# Patient Record
Sex: Female | Born: 1965
Health system: Southern US, Community
[De-identification: ages and names within clinical notes are randomized; demographics above are authoritative.]

## PROBLEM LIST (undated history)

## (undated) DIAGNOSIS — F329 Major depressive disorder, single episode, unspecified: Secondary | ICD-10-CM

## (undated) DIAGNOSIS — T7840XA Allergy, unspecified, initial encounter: Secondary | ICD-10-CM

## (undated) DIAGNOSIS — F419 Anxiety disorder, unspecified: Secondary | ICD-10-CM

## (undated) DIAGNOSIS — D249 Benign neoplasm of unspecified breast: Secondary | ICD-10-CM

## (undated) DIAGNOSIS — F32A Depression, unspecified: Secondary | ICD-10-CM

## (undated) DIAGNOSIS — F988 Other specified behavioral and emotional disorders with onset usually occurring in childhood and adolescence: Secondary | ICD-10-CM

## (undated) DIAGNOSIS — R896 Abnormal cytological findings in specimens from other organs, systems and tissues: Secondary | ICD-10-CM

## (undated) DIAGNOSIS — K219 Gastro-esophageal reflux disease without esophagitis: Secondary | ICD-10-CM

## (undated) DIAGNOSIS — N289 Disorder of kidney and ureter, unspecified: Secondary | ICD-10-CM

## (undated) HISTORY — PX: EYE SURGERY: SHX253

## (undated) HISTORY — DX: Disorder of kidney and ureter, unspecified: N28.9

## (undated) HISTORY — PX: TONSILLECTOMY: SUR1361

## (undated) HISTORY — DX: Major depressive disorder, single episode, unspecified: F32.9

## (undated) HISTORY — DX: Abnormal cytological findings in specimens from other organs, systems and tissues: R89.6

## (undated) HISTORY — DX: Depression, unspecified: F32.A

## (undated) HISTORY — DX: Benign neoplasm of unspecified breast: D24.9

## (undated) HISTORY — DX: Gastro-esophageal reflux disease without esophagitis: K21.9

## (undated) HISTORY — PX: ANTERIOR CRUCIATE LIGAMENT REPAIR: SHX115

## (undated) HISTORY — DX: Other specified behavioral and emotional disorders with onset usually occurring in childhood and adolescence: F98.8

## (undated) HISTORY — DX: Allergy, unspecified, initial encounter: T78.40XA

## (undated) HISTORY — DX: Anxiety disorder, unspecified: F41.9

---

## 1995-01-30 HISTORY — PX: BREAST FIBROADENOMA SURGERY: SHX580

## 2000-03-20 ENCOUNTER — Encounter: Admission: RE | Admit: 2000-03-20 | Discharge: 2000-03-20 | Payer: Self-pay | Admitting: Family Medicine

## 2000-03-20 ENCOUNTER — Encounter: Payer: Self-pay | Admitting: Family Medicine

## 2000-10-07 ENCOUNTER — Encounter: Payer: Self-pay | Admitting: Family Medicine

## 2000-10-07 ENCOUNTER — Encounter: Admission: RE | Admit: 2000-10-07 | Discharge: 2000-10-07 | Payer: Self-pay | Admitting: Family Medicine

## 2001-03-01 ENCOUNTER — Emergency Department (HOSPITAL_COMMUNITY): Admission: EM | Admit: 2001-03-01 | Discharge: 2001-03-01 | Payer: Self-pay | Admitting: Emergency Medicine

## 2003-09-01 ENCOUNTER — Other Ambulatory Visit: Admission: RE | Admit: 2003-09-01 | Discharge: 2003-09-01 | Payer: Self-pay | Admitting: Family Medicine

## 2004-10-04 ENCOUNTER — Other Ambulatory Visit: Admission: RE | Admit: 2004-10-04 | Discharge: 2004-10-04 | Payer: Self-pay | Admitting: Gynecology

## 2005-10-05 ENCOUNTER — Other Ambulatory Visit: Admission: RE | Admit: 2005-10-05 | Discharge: 2005-10-05 | Payer: Self-pay | Admitting: Gynecology

## 2006-10-11 ENCOUNTER — Other Ambulatory Visit: Admission: RE | Admit: 2006-10-11 | Discharge: 2006-10-11 | Payer: Self-pay | Admitting: Gynecology

## 2007-10-16 ENCOUNTER — Ambulatory Visit: Payer: Self-pay | Admitting: Women's Health

## 2007-10-16 ENCOUNTER — Encounter: Payer: Self-pay | Admitting: Women's Health

## 2007-10-16 ENCOUNTER — Other Ambulatory Visit: Admission: RE | Admit: 2007-10-16 | Discharge: 2007-10-16 | Payer: Self-pay | Admitting: Gynecology

## 2008-01-30 DIAGNOSIS — IMO0001 Reserved for inherently not codable concepts without codable children: Secondary | ICD-10-CM

## 2008-01-30 HISTORY — DX: Reserved for inherently not codable concepts without codable children: IMO0001

## 2008-12-31 ENCOUNTER — Ambulatory Visit: Payer: Self-pay | Admitting: Women's Health

## 2008-12-31 ENCOUNTER — Other Ambulatory Visit: Admission: RE | Admit: 2008-12-31 | Discharge: 2008-12-31 | Payer: Self-pay | Admitting: Gynecology

## 2009-05-25 ENCOUNTER — Ambulatory Visit (HOSPITAL_COMMUNITY): Admission: RE | Admit: 2009-05-25 | Discharge: 2009-05-25 | Payer: Self-pay | Admitting: Sports Medicine

## 2009-06-07 ENCOUNTER — Emergency Department (HOSPITAL_COMMUNITY): Admission: EM | Admit: 2009-06-07 | Discharge: 2009-06-07 | Payer: Self-pay | Admitting: Emergency Medicine

## 2009-07-27 ENCOUNTER — Ambulatory Visit: Payer: Self-pay | Admitting: Women's Health

## 2009-07-27 ENCOUNTER — Other Ambulatory Visit: Admission: RE | Admit: 2009-07-27 | Discharge: 2009-07-27 | Payer: Self-pay | Admitting: Gynecology

## 2009-08-03 ENCOUNTER — Ambulatory Visit (HOSPITAL_COMMUNITY): Admission: RE | Admit: 2009-08-03 | Discharge: 2009-08-03 | Payer: Self-pay | Admitting: Gynecology

## 2010-04-18 LAB — URINALYSIS, ROUTINE W REFLEX MICROSCOPIC
Glucose, UA: NEGATIVE mg/dL
Ketones, ur: 40 mg/dL — AB
Nitrite: NEGATIVE
Protein, ur: 30 mg/dL — AB
Specific Gravity, Urine: 1.027 (ref 1.005–1.030)
Urobilinogen, UA: 0.2 mg/dL (ref 0.0–1.0)
pH: 6 (ref 5.0–8.0)

## 2010-04-18 LAB — DIFFERENTIAL
Basophils Absolute: 0 10*3/uL (ref 0.0–0.1)
Basophils Relative: 0 % (ref 0–1)
Eosinophils Absolute: 0.1 10*3/uL (ref 0.0–0.7)
Monocytes Relative: 4 % (ref 3–12)
Neutro Abs: 10 10*3/uL — ABNORMAL HIGH (ref 1.7–7.7)
Neutrophils Relative %: 89 % — ABNORMAL HIGH (ref 43–77)

## 2010-04-18 LAB — COMPREHENSIVE METABOLIC PANEL
Alkaline Phosphatase: 64 U/L (ref 39–117)
BUN: 12 mg/dL (ref 6–23)
CO2: 19 mEq/L (ref 19–32)
GFR calc non Af Amer: >60 mL/min (ref >60)
Glucose, Bld: 129 mg/dL — ABNORMAL HIGH (ref 70–99)
Potassium: 4.1 mEq/L (ref 3.5–5.1)
Total Bilirubin: 1.1 mg/dL (ref 0.3–1.2)
Total Protein: 7.3 g/dL (ref 6.0–8.3)

## 2010-04-18 LAB — CBC
HCT: 44 % (ref 36.0–46.0)
Hemoglobin: 15.8 g/dL — ABNORMAL HIGH (ref 12.0–15.0)
RBC: 4.91 MIL/uL (ref 3.87–5.11)
RDW: 12.3 % (ref 11.5–15.5)

## 2010-04-18 LAB — LIPASE, BLOOD: Lipase: 21 U/L (ref 11–59)

## 2010-04-18 LAB — URINE MICROSCOPIC-ADD ON

## 2010-04-18 LAB — PREGNANCY, URINE: Preg Test, Ur: NEGATIVE

## 2011-03-07 IMAGING — CT CT ABD-PELV W/ CM
2 of 5 series · 17 of 46 positions shown, 19 images · IV contrast (water protocol & 100 ml omni 300)
Comparison: None.

CLINICAL DATA: Abdominal pain.  Vomiting.  Diarrhea.  Left-sided
pain.

CT ABDOMEN AND PELVIS WITH CONTRAST
TECHNIQUE: Multidetector CT imaging of the abdomen and pelvis was
performed following the standard protocol during bolus
administration of intravenous contrast.
Contrast: 100  ml [id number]

[Series 2: abd pelvis · axial · 0.90mm/px · z∈[-466,-41]mm · 14 of 96 slices shown, 16 images]
[im 6/96  soft-tissue]
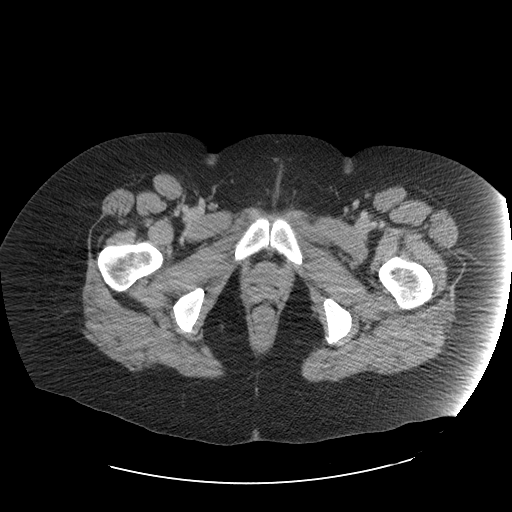
[im 6/96  bone]
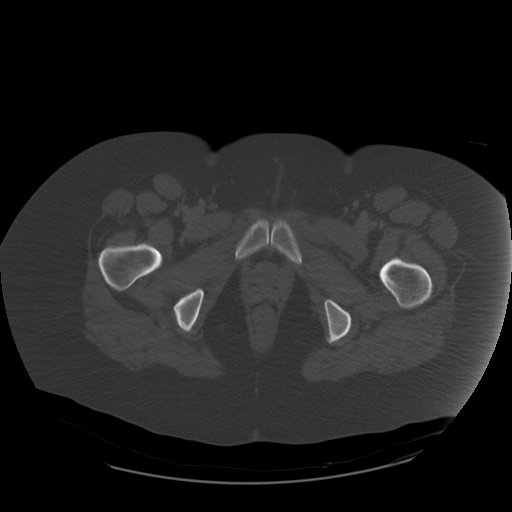
[im 11/96  soft-tissue]
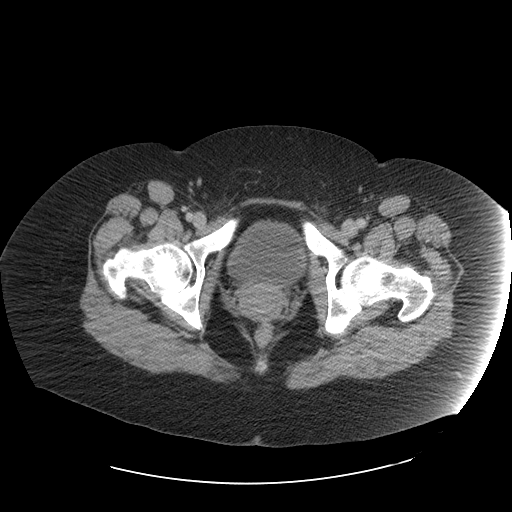
[im 21/96  soft-tissue]
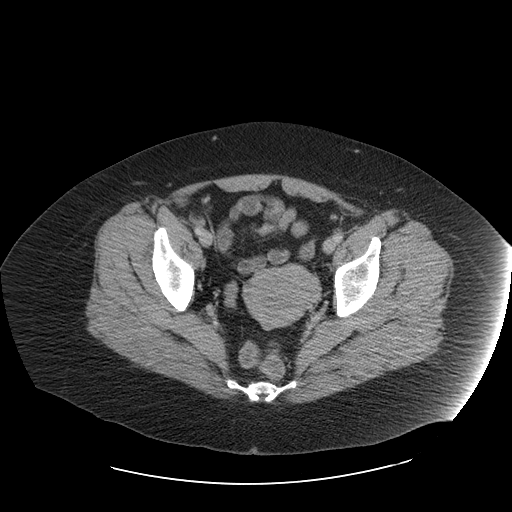
[im 26/96  soft-tissue]
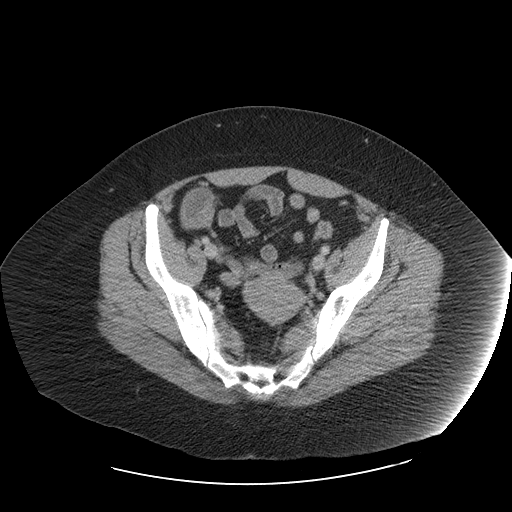
[im 31/96  soft-tissue]
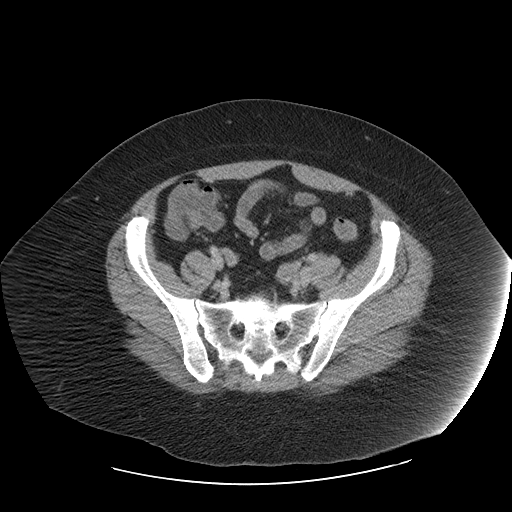
[im 41/96  soft-tissue]
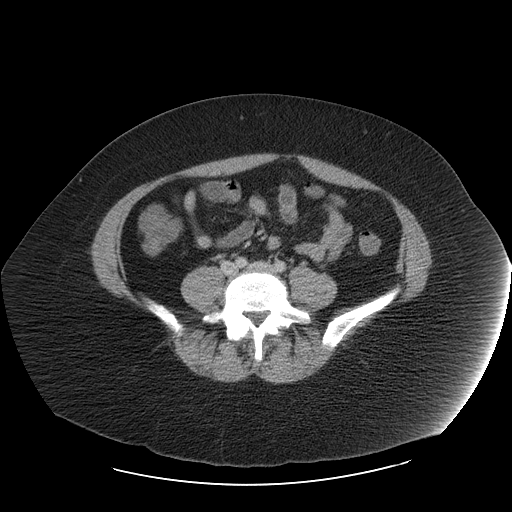
[im 46/96  soft-tissue]
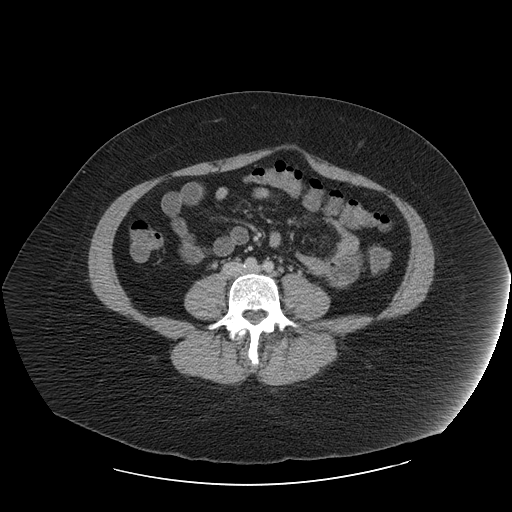
[im 51/96  soft-tissue]
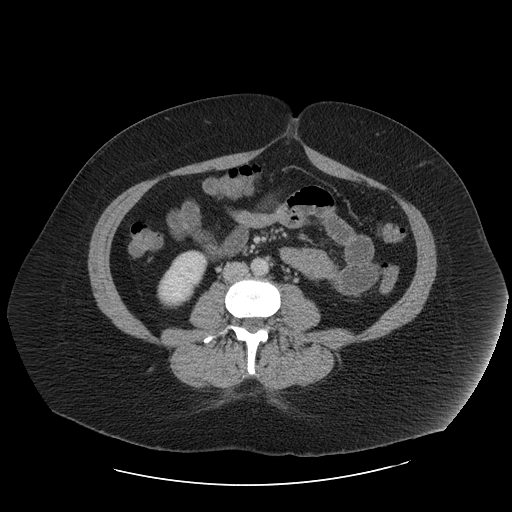
[im 56/96  soft-tissue]
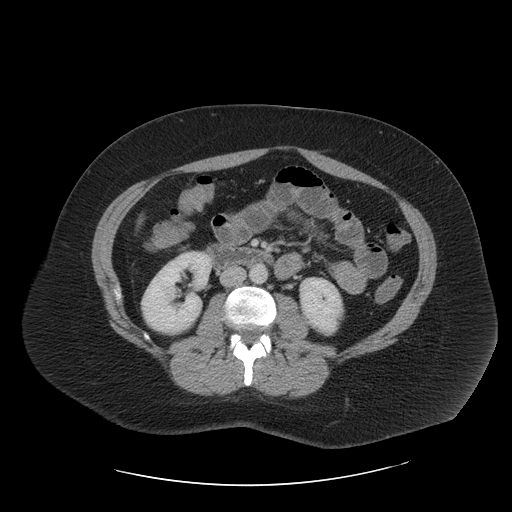
[im 56/96  bone]
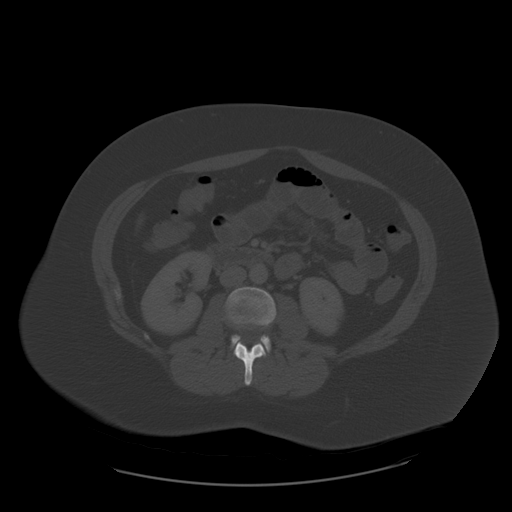
[im 66/96  soft-tissue]
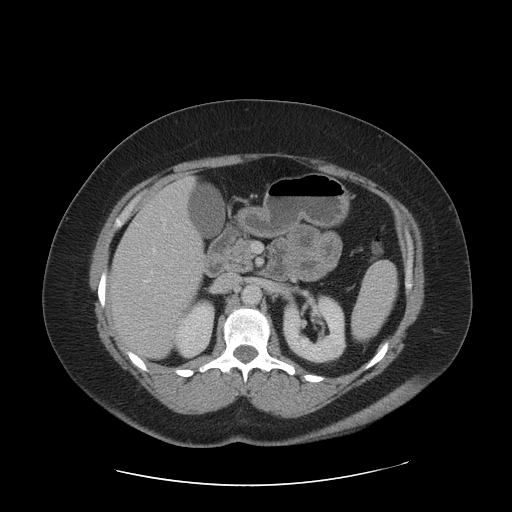
[im 71/96  soft-tissue]
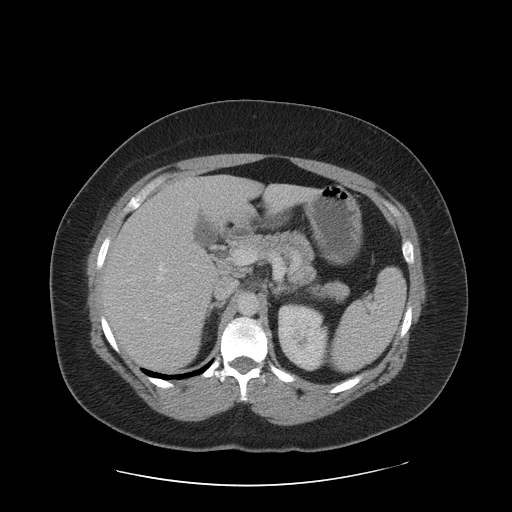
[im 76/96  soft-tissue]
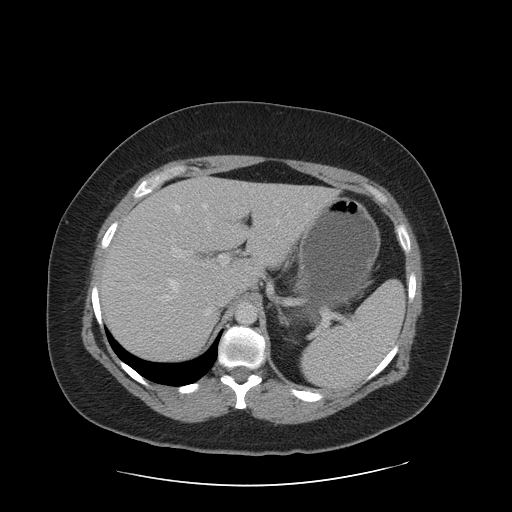
[im 86/96  soft-tissue]
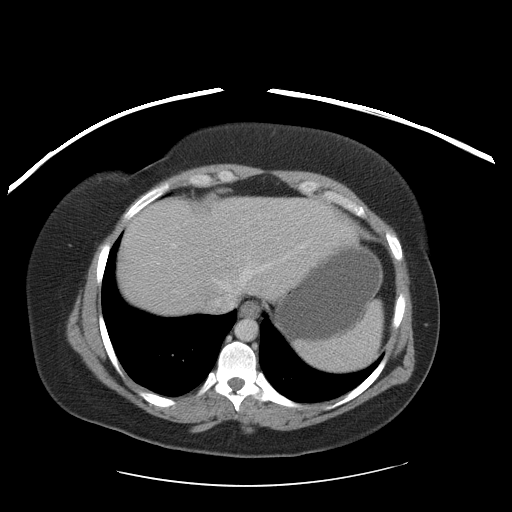
[im 91/96  soft-tissue]
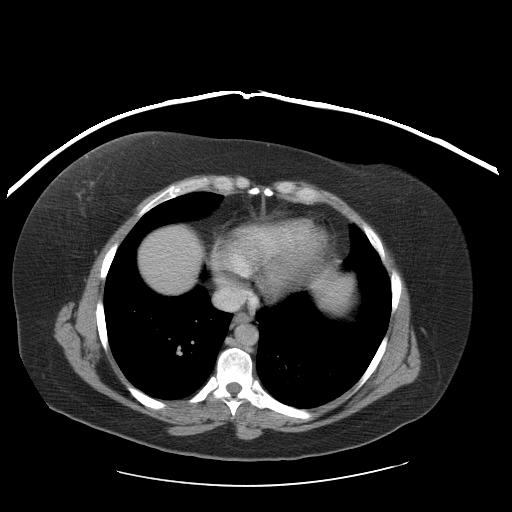

[Series 401: cor · coronal · 0.96mm/px · 3 of 99 slices shown]
[im 33/99  soft-tissue]
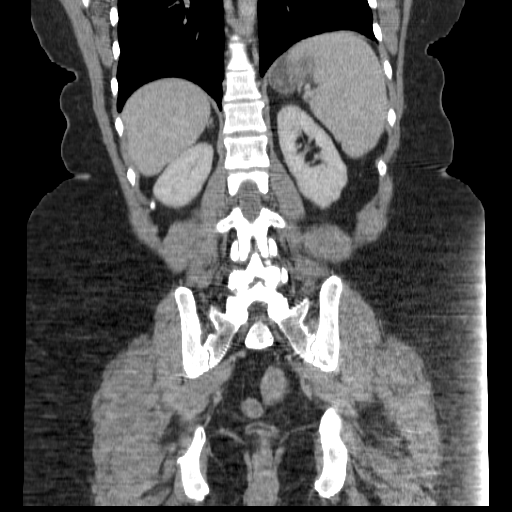
[im 44/99  soft-tissue]
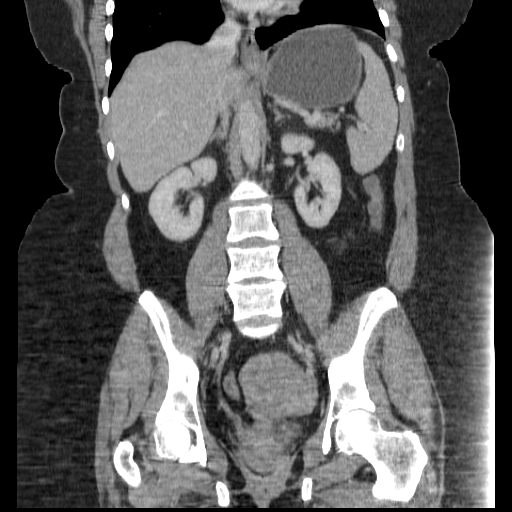
[im 55/99  soft-tissue]
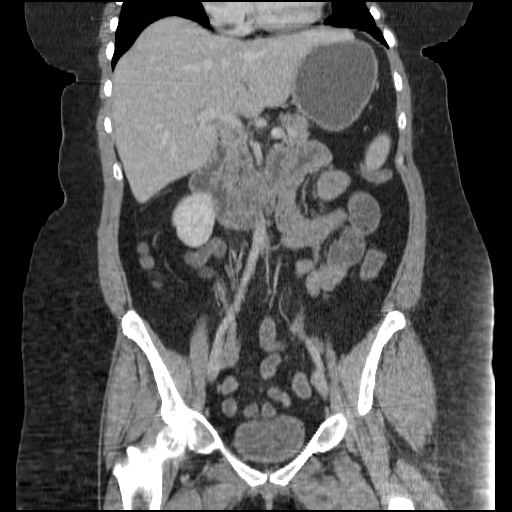

[17 of 46 positions shown; findings below may reference images not displayed]

FINDINGS: Clear lung bases. Normal heart size without pericardial
or pleural effusion.  Normal liver, spleen, stomach and, pancreas,
gallbladder, biliary tract, adrenal glands.  A central interpolar
right renal lesion measures 9 mm on axial image 40.  Greater than
fluid density, at approximately 58 HU.  Also coronal image 54.

Normal left kidney. No retroperitoneal or retrocrural adenopathy.

Normal colon and terminal ileum.  Normal appendix, including on
image 71 transverse. Normal small bowel without ascites.

  No pelvic adenopathy.    Normal urinary bladder.  Soft tissue
fullness about the left side of the uterus is suspicious for
uterine fibroid(s).  Example image 78.  No adnexal mass or
significant free fluid. No acute osseous abnormality.
IMPRESSION: 1. No acute process in the abdomen or pelvis.
2.  9 mm central interpolar right renal lesion.  Not a simple cyst.
This could represent a hemorrhagic / complex cyst or a renal cell
carcinoma.  Given its location and size, pre and post contrast MRI
would be the test of choice.  This could be performed non
emergently (i.e. as an outpatient).
3.  Fibroid uterus.

## 2011-03-12 ENCOUNTER — Other Ambulatory Visit: Payer: Self-pay | Admitting: Family Medicine

## 2011-03-12 MED ORDER — ESCITALOPRAM OXALATE 20 MG PO TABS: 20.0000 mg | ORAL_TABLET | Freq: Every day | ORAL | Status: DC

## 2011-03-14 ENCOUNTER — Telehealth: Payer: Self-pay

## 2011-03-14 NOTE — Telephone Encounter (Signed)
.  [facility] PT IN NEED OF HER ADDERALL. PLEASE CALL [phone] WHEN READY FOR P/U

## 2011-03-15 MED ORDER — AMPHETAMINE-DEXTROAMPHET ER 25 MG PO CP24: 25.0000 mg | ORAL_CAPSULE | ORAL | Status: DC

## 2011-03-15 NOTE — Telephone Encounter (Signed)
LMOM THAT RX IS READY FOR PICKUP 

## 2011-03-15 NOTE — Telephone Encounter (Signed)
Done. Please call patient, ready to p/u.  Victoria Norton

## 2011-04-26 ENCOUNTER — Telehealth: Payer: Self-pay

## 2011-04-26 NOTE — Telephone Encounter (Signed)
.  [facility] Patient called requesting refill of her Adderall XR 25mg .  Patient would like call at [phone] once this is ready for pick up.

## 2011-04-27 NOTE — Telephone Encounter (Signed)
Pull chart please.  Victoria Norton 

## 2011-04-30 ENCOUNTER — Telehealth: Payer: Self-pay

## 2011-04-30 NOTE — Telephone Encounter (Signed)
Chart pulled to PA 

## 2011-04-30 NOTE — Telephone Encounter (Signed)
LMOM pt cell that she needed OV before more refills could be given.

## 2011-04-30 NOTE — Telephone Encounter (Signed)
Pt states he needs a refill on a.d.d. Meds. Has an appt to see chelle 05/24/11 @ 2:15, but wants to know if can get refill until appt

## 2011-05-02 MED ORDER — AMPHETAMINE-DEXTROAMPHET ER 25 MG PO CP24: 25.0000 mg | ORAL_CAPSULE | ORAL | Status: DC

## 2011-05-02 NOTE — Telephone Encounter (Signed)
Signed at [facility].

## 2011-05-02 NOTE — Telephone Encounter (Signed)
LMOM that request is ready for p/up

## 2011-05-02 NOTE — Telephone Encounter (Signed)
Please pull chart to PA desk.  Victoria Norton

## 2011-05-24 ENCOUNTER — Ambulatory Visit: Payer: Self-pay | Admitting: Physician Assistant

## 2011-06-21 ENCOUNTER — Encounter: Payer: Self-pay | Admitting: Physician Assistant

## 2011-06-21 ENCOUNTER — Ambulatory Visit (INDEPENDENT_AMBULATORY_CARE_PROVIDER_SITE_OTHER): Payer: 59 | Admitting: Physician Assistant

## 2011-06-21 VITALS — BP 126/80 | HR 69 | Temp 98.0°F | Resp 16 | Ht 68.0 in | Wt 237.2 lb

## 2011-06-21 DIAGNOSIS — J309 Allergic rhinitis, unspecified: Secondary | ICD-10-CM

## 2011-06-21 DIAGNOSIS — R41840 Attention and concentration deficit: Secondary | ICD-10-CM

## 2011-06-21 DIAGNOSIS — R059 Cough, unspecified: Secondary | ICD-10-CM

## 2011-06-21 DIAGNOSIS — R05 Cough: Secondary | ICD-10-CM

## 2011-06-21 MED ORDER — MOMETASONE FUROATE 50 MCG/ACT NA SUSP: 2.0000 | Freq: Every day | NASAL | Status: DC

## 2011-06-21 MED ORDER — AMPHETAMINE-DEXTROAMPHET ER 25 MG PO CP24: 25.0000 mg | ORAL_CAPSULE | ORAL | Status: DC

## 2011-06-21 MED ORDER — MONTELUKAST SODIUM 10 MG PO TABS: 10.0000 mg | ORAL_TABLET | Freq: Every day | ORAL | Status: DC

## 2011-06-21 NOTE — Progress Notes (Signed)
  Subjective:    Patient ID: Victoria Norton, female    DOB: 1965-12-02, 46 y.o.   MRN: [id number]  HPI Patient presents for follow up of ADD. She is stable on current medication. Says she takes it only as needed which is not every day. Is currently working from home and therefore makes her own schedule.  She also presents with allergic rhinitis. Has a history of this for which she has used Nasonex and Singulair daily during the spring when symptoms arise. Currently only taking Zyrtec daily.     Review of Systems  Constitutional: Negative for fever and chills.  HENT: Positive for sore throat (scratchy), rhinorrhea, sneezing, postnasal drip and sinus pressure. Negative for ear pain.   Respiratory: Positive for cough. Negative for wheezing.   Skin: Negative for rash.  Neurological: Negative for dizziness.       Objective:   Physical Exam  Constitutional: She is oriented to person, place, and time. She appears well-developed and well-nourished.  HENT:  Head: Normocephalic and atraumatic.  Right Ear: External ear normal.  Left Ear: External ear normal.  Nose: Right sinus exhibits no maxillary sinus tenderness and no frontal sinus tenderness. Left sinus exhibits no maxillary sinus tenderness and no frontal sinus tenderness.  Mouth/Throat: Oropharynx is clear and moist. No oropharyngeal exudate.  Eyes: Conjunctivae are normal.  Neck: Neck supple.  Cardiovascular: Normal rate, regular rhythm and normal heart sounds.   Pulmonary/Chest: Effort normal and breath sounds normal.  Lymphadenopathy:    She has no cervical adenopathy.  Neurological: She is alert and oriented to person, place, and time.  Skin: Skin is warm and dry.  Psychiatric: She has a normal mood and affect. Her behavior is normal. Judgment and thought content normal.          Assessment & Plan:   1. Attention and concentration deficit  Continue current treatment plan Follow up in 6 months  amphetamine-dextroamphetamine (ADDERALL XR, 25MG ,) 25 MG 24 hr capsule  2. Allergic rhinitis  Will add Nasonex and Singulair to OTC Zyrtec mometasone (NASONEX) 50 MCG/ACT nasal spray, montelukast (SINGULAIR) 10 MG tablet  3. Cough

## 2011-06-21 NOTE — Patient Instructions (Signed)
Allergic Rhinitis  Allergic rhinitis is when the mucous membranes in the nose respond to allergens. Allergens are particles in the air that cause your body to have an allergic reaction. This causes you to release allergic antibodies. Through a chain of events, these eventually cause you to release histamine into the blood stream (hence the use of antihistamines). Although meant to be protective to the body, it is this release that causes your discomfort, such as frequent sneezing, congestion and an itchy runny nose.    CAUSES    The pollen allergens may come from grasses, trees, and weeds. This is seasonal allergic rhinitis, or "hay fever." Other allergens cause year-round allergic rhinitis (perennial allergic rhinitis) such as house dust mite allergen, pet dander and mold spores.    SYMPTOMS     Nasal stuffiness (congestion).   Runny, itchy nose with sneezing and tearing of the eyes.   There is often an itching of the mouth, eyes and ears.  It cannot be cured, but it can be controlled with medications.  DIAGNOSIS    If you are unable to determine the offending allergen, skin or blood testing may find it.  TREATMENT     Avoid the allergen.   Medications and allergy shots (immunotherapy) can help.   Hay fever may often be treated with antihistamines in pill or nasal spray forms. Antihistamines block the effects of histamine. There are over-the-counter medicines that may help with nasal congestion and swelling around the eyes. Check with your caregiver before taking or giving this medicine.  If the treatment above does not work, there are many new medications your caregiver can prescribe. Stronger medications may be used if initial measures are ineffective. Desensitizing injections can be used if medications and avoidance fails. Desensitization is when a patient is given ongoing shots until the body becomes less sensitive to the allergen. Make sure you follow up with your caregiver if problems continue.  SEEK  MEDICAL CARE IF:     You develop fever (more than 100.5 F (38.1 C).   You develop a cough that does not stop easily (persistent).   You have shortness of breath.   You start wheezing.   Symptoms interfere with normal daily activities.  Document Released: 10/10/2000 Document Revised: 01/04/2011 Document Reviewed: 04/21/2008  [employer] Patient Information 2012 [employer].

## 2011-06-21 NOTE — Progress Notes (Signed)
Patient seen and Precepted with Ms. Marte, PA-C and agree.  

## 2011-07-09 ENCOUNTER — Other Ambulatory Visit: Payer: Self-pay | Admitting: Physician Assistant

## 2011-07-09 ENCOUNTER — Telehealth: Payer: Self-pay

## 2011-07-09 NOTE — Telephone Encounter (Signed)
PATIENT IS TELLING PHARMACY THAT WE SHOULD HAVE SENT IN A REFILL OF LEXAPRO BEFORE MEMORIAL DAY, BUT THEY HAVE'NT RECEIVED ANYTHING.  THE PHARMACY HAS SENT A REQUEST

## 2011-07-10 ENCOUNTER — Other Ambulatory Visit: Payer: Self-pay | Admitting: *Deleted

## 2011-07-10 NOTE — Telephone Encounter (Signed)
rx sent to pharmacy

## 2011-07-10 NOTE — Telephone Encounter (Signed)
Refill sent yesterday

## 2011-08-07 ENCOUNTER — Telehealth: Payer: Self-pay

## 2011-08-07 NOTE — Telephone Encounter (Signed)
PT WOULD LIKE A REFILL ON ADDERALL XR. ZOXW#[phone]

## 2011-08-09 ENCOUNTER — Other Ambulatory Visit: Payer: Self-pay | Admitting: Physician Assistant

## 2011-08-09 DIAGNOSIS — R41840 Attention and concentration deficit: Secondary | ICD-10-CM

## 2011-08-09 MED ORDER — AMPHETAMINE-DEXTROAMPHET ER 25 MG PO CP24: 25.0000 mg | ORAL_CAPSULE | ORAL | Status: DC

## 2011-08-10 NOTE — Telephone Encounter (Signed)
Notified pt that Rx is ready for p/up 

## 2011-08-29 ENCOUNTER — Encounter: Payer: Self-pay | Admitting: *Deleted

## 2011-08-29 ENCOUNTER — Ambulatory Visit (INDEPENDENT_AMBULATORY_CARE_PROVIDER_SITE_OTHER): Payer: BC Managed Care – PPO | Admitting: Women's Health

## 2011-08-29 ENCOUNTER — Other Ambulatory Visit (HOSPITAL_COMMUNITY)
Admission: RE | Admit: 2011-08-29 | Discharge: 2011-08-29 | Disposition: A | Discharge status: Home / Self Care | Payer: BC Managed Care – PPO | Source: Ambulatory Visit | Attending: Women's Health | Admitting: Women's Health

## 2011-08-29 ENCOUNTER — Encounter: Payer: Self-pay | Admitting: Women's Health

## 2011-08-29 VITALS — BP 128/88 | Ht 68.25 in | Wt 237.0 lb

## 2011-08-29 DIAGNOSIS — Z113 Encounter for screening for infections with a predominantly sexual mode of transmission: Secondary | ICD-10-CM

## 2011-08-29 DIAGNOSIS — N938 Other specified abnormal uterine and vaginal bleeding: Secondary | ICD-10-CM | POA: Insufficient documentation

## 2011-08-29 DIAGNOSIS — Z01419 Encounter for gynecological examination (general) (routine) without abnormal findings: Secondary | ICD-10-CM

## 2011-08-29 DIAGNOSIS — Z1151 Encounter for screening for human papillomavirus (HPV): Secondary | ICD-10-CM | POA: Insufficient documentation

## 2011-08-29 DIAGNOSIS — Z833 Family history of diabetes mellitus: Secondary | ICD-10-CM

## 2011-08-29 DIAGNOSIS — N949 Unspecified condition associated with female genital organs and menstrual cycle: Secondary | ICD-10-CM

## 2011-08-29 DIAGNOSIS — N926 Irregular menstruation, unspecified: Secondary | ICD-10-CM

## 2011-08-29 LAB — TSH: TSH: 1.134 u[IU]/mL (ref 0.350–4.500)

## 2011-08-29 LAB — PROLACTIN: Prolactin: 4.5 ng/mL

## 2011-08-29 NOTE — Patient Instructions (Addendum)
Place abnormal uterine bleeding patient instructions here. Health Maintenance, Females A healthy lifestyle and preventative care can promote health and wellness.  Maintain regular health, dental, and eye exams.   Eat a healthy diet. Foods like vegetables, fruits, whole grains, low-fat dairy products, and lean protein foods contain the nutrients you need without too many calories. Decrease your intake of foods high in solid fats, added sugars, and salt. Get information about a proper diet from your caregiver, if necessary.   Regular physical exercise is one of the most important things you can do for your health. Most adults should get at least 150 minutes of moderate-intensity exercise (any activity that increases your heart rate and causes you to sweat) each week. In addition, most adults need muscle-strengthening exercises on 2 or more days a week.    Maintain a healthy weight. The body mass index (BMI) is a screening tool to identify possible weight problems. It provides an estimate of body fat based on height and weight. Your caregiver can help determine your BMI, and can help you achieve or maintain a healthy weight. For adults 20 years and older:   A BMI below 18.5 is considered underweight.   A BMI of 18.5 to 24.9 is normal.   A BMI of 25 to 29.9 is considered overweight.   A BMI of 30 and above is considered obese.   Maintain normal blood lipids and cholesterol by exercising and minimizing your intake of saturated fat. Eat a balanced diet with plenty of fruits and vegetables. Blood tests for lipids and cholesterol should begin at age 35 and be repeated every 5 years. If your lipid or cholesterol levels are high, you are over 50, or you are a high risk for heart disease, you may need your cholesterol levels checked more frequently.Ongoing high lipid and cholesterol levels should be treated with medicines if diet and exercise are not effective.   If you smoke, find out from your  caregiver how to quit. If you do not use tobacco, do not start.   If you are pregnant, do not drink alcohol. If you are breastfeeding, be very cautious about drinking alcohol. If you are not pregnant and choose to drink alcohol, do not exceed 1 drink per day. One drink is considered to be 12 ounces (355 mL) of beer, 5 ounces (148 mL) of wine, or 1.5 ounces (44 mL) of liquor.   Avoid use of street drugs. Do not share needles with anyone. Ask for help if you need support or instructions about stopping the use of drugs.   High blood pressure causes heart disease and increases the risk of stroke. Blood pressure should be checked at least every 1 to 2 years. Ongoing high blood pressure should be treated with medicines, if weight loss and exercise are not effective.   If you are 70 to 47 years old, ask your caregiver if you should take aspirin to prevent strokes.   Diabetes screening involves taking a blood sample to check your fasting blood sugar level. This should be done once every 3 years, after age 22, if you are within normal weight and without risk factors for diabetes. Testing should be considered at a younger age or be carried out more frequently if you are overweight and have at least 1 risk factor for diabetes.   Breast cancer screening is essential preventative care for women. You should practice "breast self-awareness." This means understanding the normal appearance and feel of your breasts and may include breast  self-examination. Any changes detected, no matter how small, should be reported to a caregiver. Women in their 37s and 30s should have a clinical breast exam (CBE) by a caregiver as part of a regular health exam every 1 to 3 years. After age 52, women should have a CBE every year. Starting at age 58, women should consider having a mammogram (breast X-ray) every year. Women who have a family history of breast cancer should talk to their caregiver about genetic screening. Women at a high  risk of breast cancer should talk to their caregiver about having an MRI and a mammogram every year.   The Pap test is a screening test for cervical cancer. Women should have a Pap test starting at age 36. Between ages 82 and 72, Pap tests should be repeated every 2 years. Beginning at age 29, you should have a Pap test every 3 years as long as the past 3 Pap tests have been normal. If you had a hysterectomy for a problem that was not cancer or a condition that could lead to cancer, then you no longer need Pap tests. If you are between ages 35 and 34, and you have had normal Pap tests going back 10 years, you no longer need Pap tests. If you have had past treatment for cervical cancer or a condition that could lead to cancer, you need Pap tests and screening for cancer for at least 20 years after your treatment. If Pap tests have been discontinued, risk factors (such as a new sexual partner) need to be reassessed to determine if screening should be resumed. Some women have medical problems that increase the chance of getting cervical cancer. In these cases, your caregiver may recommend more frequent screening and Pap tests.   The human papillomavirus (HPV) test is an additional test that may be used for cervical cancer screening. The HPV test looks for the virus that can cause the cell changes on the cervix. The cells collected during the Pap test can be tested for HPV. The HPV test could be used to screen women aged 63 years and older, and should be used in women of any age who have unclear Pap test results. After the age of 27, women should have HPV testing at the same frequency as a Pap test.   Colorectal cancer can be detected and often prevented. Most routine colorectal cancer screening begins at the age of 21 and continues through age 63. However, your caregiver may recommend screening at an earlier age if you have risk factors for colon cancer. On a yearly basis, your caregiver may provide home test  kits to check for hidden blood in the stool. Use of a small camera at the end of a tube, to directly examine the colon (sigmoidoscopy or colonoscopy), can detect the earliest forms of colorectal cancer. Talk to your caregiver about this at age 94, when routine screening begins. Direct examination of the colon should be repeated every 5 to 10 years through age 55, unless early forms of pre-cancerous polyps or small growths are found.   Hepatitis C blood testing is recommended for all people born from [birth year] through 1966 and any individual with known risks for hepatitis C.   Practice safe sex. Use condoms and avoid high-risk sexual practices to reduce the spread of sexually transmitted infections (STIs). Sexually active women aged 46 and younger should be checked for Chlamydia, which is a common sexually transmitted infection. Older women with new or multiple partners should also  be tested for Chlamydia. Testing for other STIs is recommended if you are sexually active and at increased risk.   Osteoporosis is a disease in which the bones lose minerals and strength with aging. This can result in serious bone fractures. The risk of osteoporosis can be identified using a bone density scan. Women ages 58 and over and women at risk for fractures or osteoporosis should discuss screening with their caregivers. Ask your caregiver whether you should be taking a calcium supplement or vitamin D to reduce the rate of osteoporosis.   Menopause can be associated with physical symptoms and risks. Hormone replacement therapy is available to decrease symptoms and risks. You should talk to your caregiver about whether hormone replacement therapy is right for you.   Use sunscreen with a sun protection factor (SPF) of 30 or greater. Apply sunscreen liberally and repeatedly throughout the day. You should seek shade when your shadow is shorter than you. Protect yourself by wearing long sleeves, pants, a wide-brimmed hat, and  sunglasses year round, whenever you are outdoors.   Notify your caregiver of new moles or changes in moles, especially if there is a change in shape or color. Also notify your caregiver if a mole is larger than the size of a pencil eraser.   Stay current with your immunizations.  Document Released: 07/31/2010 Document Revised: 01/04/2011 Document Reviewed: 07/31/2010 [facility] Patient Information 2012 [place], [place].

## 2011-08-29 NOTE — Addendum Note (Signed)
Addended byValeda Malm L on: 08/29/2011 11:55 AM   Modules accepted: Orders

## 2011-08-29 NOTE — Progress Notes (Signed)
Victoria Norton May 29, 1965 [id number]    History:    The patient presents for annual exam.  Monthly 5-6 day cycles with heavy bleeding, last 2 months spotting throughout the month. History of ascus in 2010 with negative HR HPV. Has not been sexually active for 2 years. History of normal mammograms overdue, scheduled next week. History of a spot on her kidney has followup at [facility] in September. Had an annual physical at primary care with normal labs per patient.   Past medical history, past surgical history, family history and social history were all reviewed and documented in the EPIC chart. 2 sons Victoria Norton and Victoria Norton ages 32 and 50 both doing well. In the process of a divorce.   ROS:  A  ROS was performed and pertinent positives and negatives are included in the history.  Exam:  Filed Vitals:   08/29/11 1035  BP: 128/88    General appearance:  Normal Head/Neck:  Normal, without cervical or supraclavicular adenopathy. Thyroid:  Symmetrical, normal in size, without palpable masses or nodularity. Respiratory  Effort:  Normal  Auscultation:  Clear without wheezing or rhonchi Cardiovascular  Auscultation:  Regular rate, without rubs, murmurs or gallops  Edema/varicosities:  Not grossly evident Abdominal  Soft,nontender, without masses, guarding or rebound.  Liver/spleen:  No organomegaly noted  Hernia:  None appreciated  Skin  Inspection:  Grossly normal  Palpation:  Grossly normal Neurologic/psychiatric  Orientation:  Normal with appropriate conversation.  Mood/affect:  Normal  Genitourinary    Breasts: Examined lying and sitting.     Right: Without masses, retractions, discharge or axillary adenopathy.     Left: Without masses, retractions, discharge or axillary adenopathy.   Inguinal/mons:  Normal without inguinal adenopathy  External genitalia:  Normal  BUS/Urethra/Skene's glands:  Normal  Bladder:  Normal  Vagina:  Normal  Cervix:  Normal  Uterus:   normal in size, shape  and contour.  Midline and mobile  Adnexa/parametria:     Rt: Without masses or tenderness.   Lt: Without masses or tenderness.  Anus and perineum: Normal  Digital rectal exam: Normal sphincter tone without palpated masses or tenderness  Assessment/Plan:  46 y.o. M. WF G2 P2 for annual exam with menorrhagia and DUB.  Menorrhagia and DUB Marital issues Obesity  Plan: TSH, prolactin, UA, Pap with HPV typing and GC/Chlamydia. Sonohysterogram after next cycle with Dr. Audie Box. Reviewed possible her option or Mirena IUD for menorrhagia. SBE's, annual mammogram, keep scheduled appointment, calcium rich diet, vitamin D 1000 daily encouraged. Reviewed importance of increasing exercise, decreasing calories for weight loss for health. Continue counseling for marital issues. BP 130/88, states has never had increased blood pressure in the past will monitor return to primary care if continues greater than 130/80.    Harrington Challenger [facility], 11:41 AM 08/29/2011

## 2011-08-30 LAB — URINALYSIS W MICROSCOPIC + REFLEX CULTURE
Leukocytes, UA: NEGATIVE
Nitrite: NEGATIVE
Protein, ur: NEGATIVE mg/dL
Squamous Epithelial / LPF: NONE SEEN
Urobilinogen, UA: 0.2 mg/dL (ref 0.0–1.0)

## 2011-09-01 LAB — URINE CULTURE: Colony Count: 30000

## 2011-09-10 ENCOUNTER — Encounter: Payer: Self-pay | Admitting: Women's Health

## 2011-09-12 ENCOUNTER — Other Ambulatory Visit: Payer: BC Managed Care – PPO

## 2011-09-12 ENCOUNTER — Ambulatory Visit: Payer: BC Managed Care – PPO | Admitting: Gynecology

## 2011-09-13 ENCOUNTER — Other Ambulatory Visit: Payer: Self-pay | Admitting: *Deleted

## 2011-09-13 DIAGNOSIS — R928 Other abnormal and inconclusive findings on diagnostic imaging of breast: Secondary | ICD-10-CM

## 2011-09-14 ENCOUNTER — Other Ambulatory Visit: Payer: Self-pay | Admitting: Women's Health

## 2011-09-14 DIAGNOSIS — R928 Other abnormal and inconclusive findings on diagnostic imaging of breast: Secondary | ICD-10-CM

## 2011-09-25 ENCOUNTER — Telehealth: Payer: Self-pay

## 2011-09-25 DIAGNOSIS — R41840 Attention and concentration deficit: Secondary | ICD-10-CM

## 2011-09-25 MED ORDER — AMPHETAMINE-DEXTROAMPHET ER 25 MG PO CP24: 25.0000 mg | ORAL_CAPSULE | ORAL | Status: DC

## 2011-09-25 NOTE — Telephone Encounter (Signed)
PT IN NEED OF HER ADDERALL XR 25MG S. PLEASE CALL [phone] WHEN READY FOR HER TO P/U

## 2011-09-25 NOTE — Telephone Encounter (Signed)
Done and printed

## 2011-09-26 NOTE — Telephone Encounter (Signed)
Left mssg to advise ready for pick up

## 2011-10-29 ENCOUNTER — Telehealth: Payer: Self-pay

## 2011-10-29 DIAGNOSIS — R41840 Attention and concentration deficit: Secondary | ICD-10-CM

## 2011-10-29 NOTE — Telephone Encounter (Signed)
Pt is requesting refill on amphetamine-dextroamphetamine (ADDERALL XR) 25 MG 24 hr capsule   Please call [phone] when script is ready for pick up

## 2011-10-30 MED ORDER — AMPHETAMINE-DEXTROAMPHET ER 25 MG PO CP24: 25.0000 mg | ORAL_CAPSULE | ORAL | Status: DC

## 2011-10-30 NOTE — Telephone Encounter (Signed)
LMOM that Rx is ready and that she needs OV next month for RF.

## 2011-10-30 NOTE — Telephone Encounter (Signed)
At [facility].  Pt needs an ov next month.

## 2011-12-05 ENCOUNTER — Telehealth: Payer: Self-pay

## 2011-12-05 DIAGNOSIS — R41840 Attention and concentration deficit: Secondary | ICD-10-CM

## 2011-12-05 MED ORDER — AMPHETAMINE-DEXTROAMPHET ER 25 MG PO CP24: 25.0000 mg | ORAL_CAPSULE | ORAL | Status: DC

## 2011-12-05 NOTE — Telephone Encounter (Signed)
LMOM Rx ready to pick up. 

## 2011-12-05 NOTE — Telephone Encounter (Signed)
Refill done, printed, signed, and at the TL desk. Will need an office visit for further refills.

## 2011-12-05 NOTE — Telephone Encounter (Signed)
Last OV for ADD 06/21/11. Last RF on 10/30/11.

## 2011-12-05 NOTE — Telephone Encounter (Signed)
Pt would like a refill on adderall xr 25 mg. [facility]# [phone]

## 2011-12-18 ENCOUNTER — Ambulatory Visit: Payer: 59 | Admitting: Physician Assistant

## 2012-01-03 ENCOUNTER — Encounter: Payer: Self-pay | Admitting: Physician Assistant

## 2012-01-03 ENCOUNTER — Ambulatory Visit (INDEPENDENT_AMBULATORY_CARE_PROVIDER_SITE_OTHER): Payer: BC Managed Care – PPO | Admitting: Physician Assistant

## 2012-01-03 VITALS — BP 124/80 | HR 62 | Temp 98.5°F | Resp 16 | Ht 68.0 in | Wt 240.0 lb

## 2012-01-03 DIAGNOSIS — D249 Benign neoplasm of unspecified breast: Secondary | ICD-10-CM | POA: Insufficient documentation

## 2012-01-03 DIAGNOSIS — J309 Allergic rhinitis, unspecified: Secondary | ICD-10-CM | POA: Insufficient documentation

## 2012-01-03 DIAGNOSIS — N289 Disorder of kidney and ureter, unspecified: Secondary | ICD-10-CM | POA: Insufficient documentation

## 2012-01-03 DIAGNOSIS — F988 Other specified behavioral and emotional disorders with onset usually occurring in childhood and adolescence: Secondary | ICD-10-CM | POA: Insufficient documentation

## 2012-01-03 DIAGNOSIS — Z23 Encounter for immunization: Secondary | ICD-10-CM

## 2012-01-03 DIAGNOSIS — F341 Dysthymic disorder: Secondary | ICD-10-CM

## 2012-01-03 DIAGNOSIS — R41840 Attention and concentration deficit: Secondary | ICD-10-CM

## 2012-01-03 DIAGNOSIS — F32A Depression, unspecified: Secondary | ICD-10-CM | POA: Insufficient documentation

## 2012-01-03 DIAGNOSIS — F419 Anxiety disorder, unspecified: Secondary | ICD-10-CM | POA: Insufficient documentation

## 2012-01-03 MED ORDER — AMPHETAMINE-DEXTROAMPHET ER 25 MG PO CP24: 25.0000 mg | ORAL_CAPSULE | ORAL | Status: DC

## 2012-01-03 NOTE — Progress Notes (Signed)
Subjective:    Patient ID: Victoria Norton, female    DOB: March 03, 1965, 46 y.o.   MRN: [id number]  HPI This 46 y.o. female presents for evaluation of ADD, anxiety/depression, AR. She feels like she is doing really well.  Since her last visit, she has updated a number of outstanding health maintenance issues-pap, breast exam and mammogram, and had a follow-up study of the renal lesion, which is stable.  She is to have one more follow-up in 12 months, and if it remains stable, no additional follow-up will be recommended.    Her concentration and focus, ability to shift attention, are well controlled on her current dose of Adderall.  She's sleeping well and her mood is good.  Allergies are well controlled, even without regular use of Singulair.  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  She plans to go back to realty after the New Year.  Past Medical History  Diagnosis Date  . ASCUS (atypical squamous cells of undetermined significance) on Pap smear 2010    Neg HPV  . Allergy   . Anxiety and depression   . ADD (attention deficit disorder)   . Fibroadenoma of breast     LEFT  . Kidney lesion     LEFT    Past Surgical History  Procedure Date  . Tonsillectomy   . Anterior cruciate ligament repair   . Breast fibroadenoma surgery 1997    Prior to Admission medications   Medication Sig Start Date End Date Taking? Authorizing Provider  amphetamine-dextroamphetamine (ADDERALL XR) 25 MG 24 hr capsule Take 1 capsule (25 mg total) by mouth every morning. 12/05/11 01/04/12 Yes Ryan M Dunn, PA-C  calcium-vitamin D (OSCAL WITH D) 500-200 MG-UNIT per tablet Take 1 tablet by mouth daily.   Yes Historical Provider, MD  Cholecalciferol (D-3-5) 5000 UNITS capsule Take 5,000 Units by mouth daily.   Yes Historical Provider, MD  escitalopram (LEXAPRO) 20 MG tablet TAKE ONE TABLET  BY MOUTH EVERY DAY 07/09/11  Yes Heather M Marte, PA-C  mometasone (NASONEX) 50 MCG/ACT nasal spray Place 2 sprays into the nose daily. 06/21/11 06/20/12 Yes Heather Jaquita Rector, PA-C  Multiple Vitamin (MULTIVITAMIN) capsule Take 1 capsule by mouth daily.   Yes Historical Provider, MD  fish oil-omega-3 fatty acids 1000 MG capsule Take 2 g by mouth daily.    Historical Provider, MD  montelukast (SINGULAIR) 10 MG tablet Take 1 tablet (10 mg total) by mouth at bedtime. 06/21/11 06/20/12  Nelva Nay, PA-C    No Known Allergies  History   Social History  . Marital Status: Married    Spouse Name: Theron Arista    Number of Children: 2  . Years of Education: 16   Occupational History  . Science writer   . Substitute Teaching    Social History Main Topics  . Smoking status: Never Smoker   . Smokeless tobacco: Never Used  . Alcohol Use: 0.0 - 1.0 oz/week    0-2 drink(s) per week     Comment: social  . Drug Use: No  . Sexually Active: Yes -- Female partner(s)    Birth Control/ Protection: Condom   Other Topics Concern  . Not on file   Social History Narrative   Lives with her husband and two sons.  Her older son is becoming a moody teenager.    Family History  Problem Relation Age of Onset  . Heart disease Maternal Grandmother   . Osteoporosis Maternal Grandmother   . Heart disease Paternal Grandmother   . Kidney disease Maternal Grandfather   . COPD Mother     Review of Systems No chest pain, SOB, HA, dizziness, vision change, N/V, diarrhea, constipation, dysuria, urinary urgency or frequency, myalgias, arthralgias or rash.     Objective:   Physical Exam Blood pressure 124/80, pulse 62, temperature 98.5 F (36.9 C), temperature source Oral, resp. rate 16, height 5\' 8"  (1.727 m), weight 240 lb (108.863 kg), last menstrual period 01/03/2012, SpO2 97.00%. Body mass index is 36.49 kg/(m^2). Well-developed, well nourished WF who is awake, alert and oriented, in NAD. HEENT: [place]/AT,  sclera and conjunctiva are clear.  EAC are patent, TMs are normal in appearance. Nasal mucosa is pink and moist. OP is clear. Neck: supple, non-tender, no lymphadenopathy, thyromegaly. Heart: RRR, no murmur Lungs: normal effort, CTA Extremities: no cyanosis, clubbing or edema. Skin: warm and dry without rash. Psychologic: good mood and appropriate affect, normal speech and behavior.    Assessment & Plan:   1. AR (allergic rhinitis)  Continue current treatment.  2. ADD (attention deficit disorder)  amphetamine-dextroamphetamine (ADDERALL XR) 25 MG 24 hr capsule  3. Anxiety and depression  Continue Lexapro  4. Need for prophylactic vaccination and inoculation against influenza  Flu vaccine greater than or equal to 3yo preservative free IM   RTC 6 months, sooner PRN.

## 2012-05-07 ENCOUNTER — Telehealth: Payer: Self-pay

## 2012-05-07 DIAGNOSIS — F988 Other specified behavioral and emotional disorders with onset usually occurring in childhood and adolescence: Secondary | ICD-10-CM

## 2012-05-07 DIAGNOSIS — R41840 Attention and concentration deficit: Secondary | ICD-10-CM

## 2012-05-07 MED ORDER — AMPHETAMINE-DEXTROAMPHET ER 25 MG PO CP24: 25.0000 mg | ORAL_CAPSULE | ORAL | Status: DC

## 2012-05-07 NOTE — Telephone Encounter (Signed)
Patient advised Rx's at front desk for pickup

## 2012-05-07 NOTE — Telephone Encounter (Signed)
PATIENT IS CALLING BECAUSE SHE NEEDS A REFILL ON ADDERALL 25 MG. ASKED PATIENT IF SHE HAS CONTACTED HER PHARMACY AND SHE STATED THAT SHE HAS TO CALL OUR OFFICE ANYTIME SHE WANTS A REFILL ON THIS MEDICATION. PATIENT ALSO STATED THAT SHE LAST SAW CHELLE JEFFERY AND SHE WROTE THE RX FOR 3 MONTHS WORTH AT A TIME. SHE WOULD LIKE THIS AGAIN. PLEASE CALL HER AT [phone].

## 2012-05-07 NOTE — Telephone Encounter (Signed)
rx's printed.  Meds ordered this encounter  Medications  . amphetamine-dextroamphetamine (ADDERALL XR) 25 MG 24 hr capsule    Sig: Take 1 capsule (25 mg total) by mouth every morning.    Dispense:  30 capsule    Refill:  0    Order Specific Question:  Supervising Provider    Answer:  DOOLITTLE, ROBERT P [[id number]]  . amphetamine-dextroamphetamine (ADDERALL XR) 25 MG 24 hr capsule    Sig: Take 1 capsule (25 mg total) by mouth every morning. May fill on/after 06/06/2012    Dispense:  30 capsule    Refill:  0    Order Specific Question:  Supervising Provider    Answer:  DOOLITTLE, ROBERT P [[id number]]  . amphetamine-dextroamphetamine (ADDERALL XR) 25 MG 24 hr capsule    Sig: Take 1 capsule (25 mg total) by mouth every morning. May fill on/after 07/06/2012    Dispense:  30 capsule    Refill:  0    Order Specific Question:  Supervising Provider    Answer:  Merla Riches, ROBERT P [[id number]]

## 2012-05-07 NOTE — Telephone Encounter (Signed)
Chelle please advise 

## 2012-07-15 ENCOUNTER — Ambulatory Visit: Payer: BC Managed Care – PPO | Admitting: Physician Assistant

## 2012-07-17 ENCOUNTER — Ambulatory Visit: Payer: BC Managed Care – PPO | Admitting: Physician Assistant

## 2012-07-24 ENCOUNTER — Ambulatory Visit (INDEPENDENT_AMBULATORY_CARE_PROVIDER_SITE_OTHER): Payer: BC Managed Care – PPO | Admitting: Physician Assistant

## 2012-07-24 ENCOUNTER — Encounter: Payer: Self-pay | Admitting: Physician Assistant

## 2012-07-24 VITALS — BP 126/80 | HR 68 | Temp 98.2°F | Resp 16 | Ht 67.5 in | Wt 240.2 lb

## 2012-07-24 DIAGNOSIS — J309 Allergic rhinitis, unspecified: Secondary | ICD-10-CM

## 2012-07-24 DIAGNOSIS — F988 Other specified behavioral and emotional disorders with onset usually occurring in childhood and adolescence: Secondary | ICD-10-CM

## 2012-07-24 MED ORDER — AMPHETAMINE-DEXTROAMPHET ER 25 MG PO CP24: 25.0000 mg | ORAL_CAPSULE | ORAL | Status: DC

## 2012-07-24 NOTE — Progress Notes (Signed)
  Subjective:    Patient ID: Victoria Norton, female    DOB: 05-Aug-1965, 47 y.o.   MRN: [id number]  HPI  This 47 y.o. female presents for evaluation of ADD and AR, anxiety and depression. She's doing well on her current regimen.  No adverse effects.  Having a good summer-both kids are on the swim team.  Past medical history, surgical history, family history, social history and problem list reviewed.   Review of Systems No chest pain, SOB, HA, dizziness, vision change, N/V, diarrhea, constipation, dysuria, urinary urgency or frequency, myalgias, arthralgias or rash.     Objective:   Physical Exam Blood pressure 126/80, pulse 68, temperature 98.2 F (36.8 C), temperature source Oral, resp. rate 16, height 5' 7.5" (1.715 m), weight 240 lb 3.2 oz (108.954 kg), last menstrual period 07/07/2012, SpO2 98.00%. Body mass index is 37.04 kg/(m^2). Well-developed, well nourished WF who is awake, alert and oriented, in NAD. HEENT: [place]/AT, sclera and conjunctiva are clear.   Neck: supple, non-tender, no lymphadenopathy, thyromegaly. Heart: RRR, no murmur Lungs: normal effort, CTA Extremities: no cyanosis, clubbing or edema. Skin: warm and dry without rash. Psychologic: good mood and appropriate affect, normal speech and behavior.      Assessment & Plan:  ADD (attention deficit disorder) - Plan: amphetamine-dextroamphetamine (ADDERALL XR) 25 MG 24 hr capsule, amphetamine-dextroamphetamine (ADDERALL XR) 25 MG 24 hr capsule, amphetamine-dextroamphetamine (ADDERALL XR) 25 MG 24 hr capsule  AR (allergic rhinitis) - continue oral antihistamine and PRN nasonex/singulair   May call in 3 months for Rxs; RTC 6 months.  Fernande Bras, PA-C Physician Assistant-Certified [facility]

## 2012-07-24 NOTE — Patient Instructions (Signed)
Stay in touch with your mood, ability to relax and any increase in irritability or changes in sleep.  If they develop, discuss them with your therapist, and we can restart medication (something different than Lexapro if you like).

## 2012-11-28 ENCOUNTER — Telehealth: Payer: Self-pay

## 2012-11-28 DIAGNOSIS — F988 Other specified behavioral and emotional disorders with onset usually occurring in childhood and adolescence: Secondary | ICD-10-CM

## 2012-11-28 MED ORDER — AMPHETAMINE-DEXTROAMPHET ER 25 MG PO CP24: 25.0000 mg | ORAL_CAPSULE | ORAL | Status: DC

## 2012-11-28 NOTE — Telephone Encounter (Signed)
Rx's printed.  Meds ordered this encounter  Medications  . amphetamine-dextroamphetamine (ADDERALL XR) 25 MG 24 hr capsule    Sig: Take 1 capsule (25 mg total) by mouth every morning.    Dispense:  30 capsule    Refill:  0    Order Specific Question:  Supervising Provider    Answer:  DOOLITTLE, ROBERT P [[id number]]  . amphetamine-dextroamphetamine (ADDERALL XR) 25 MG 24 hr capsule    Sig: Take 1 capsule (25 mg total) by mouth every morning. May fill 30 days after date on prescription.    Dispense:  30 capsule    Refill:  0    Order Specific Question:  Supervising Provider    Answer:  DOOLITTLE, ROBERT P [[id number]]  . amphetamine-dextroamphetamine (ADDERALL XR) 25 MG 24 hr capsule    Sig: Take 1 capsule (25 mg total) by mouth every morning. May fill 60 days after date on prescription.    Dispense:  30 capsule    Refill:  0    Order Specific Question:  Supervising Provider    Answer:  DOOLITTLE, ROBERT P [[id number]]    

## 2012-11-28 NOTE — Telephone Encounter (Signed)
PATIENT STATES IT IS TIME FOR Victoria Norton TO WRITE HER PRESCRIPTIONS FOR ADDERALL XR 25 MG. SHE WOULD LIKE HER TO WRITE HER PRESCRIPTIONS FOR 3 MONTHS IF POSSIBLE. PLEASE CALL HER WHEN THEY CAN BE PICKED UP. BEST PHONE [phone] (CELL)    MBC

## 2012-11-29 NOTE — Telephone Encounter (Signed)
lmom that rx's are ready for pickup 

## 2013-01-13 ENCOUNTER — Ambulatory Visit: Payer: BC Managed Care – PPO | Admitting: Physician Assistant

## 2013-02-24 ENCOUNTER — Ambulatory Visit: Payer: BC Managed Care – PPO | Admitting: Physician Assistant

## 2013-04-21 ENCOUNTER — Ambulatory Visit (INDEPENDENT_AMBULATORY_CARE_PROVIDER_SITE_OTHER): Payer: BC Managed Care – PPO | Admitting: Physician Assistant

## 2013-04-21 ENCOUNTER — Encounter: Payer: Self-pay | Admitting: Physician Assistant

## 2013-04-21 VITALS — BP 128/86 | HR 67 | Temp 98.3°F | Resp 16 | Ht 67.75 in | Wt 240.4 lb

## 2013-04-21 DIAGNOSIS — F419 Anxiety disorder, unspecified: Secondary | ICD-10-CM

## 2013-04-21 DIAGNOSIS — F341 Dysthymic disorder: Secondary | ICD-10-CM

## 2013-04-21 DIAGNOSIS — E669 Obesity, unspecified: Secondary | ICD-10-CM | POA: Insufficient documentation

## 2013-04-21 DIAGNOSIS — F988 Other specified behavioral and emotional disorders with onset usually occurring in childhood and adolescence: Secondary | ICD-10-CM

## 2013-04-21 DIAGNOSIS — F329 Major depressive disorder, single episode, unspecified: Secondary | ICD-10-CM

## 2013-04-21 DIAGNOSIS — J309 Allergic rhinitis, unspecified: Secondary | ICD-10-CM

## 2013-04-21 MED ORDER — MOMETASONE FUROATE 50 MCG/ACT NA SUSP: 2.0000 | Freq: Every day | NASAL | Status: DC

## 2013-04-21 MED ORDER — AMPHETAMINE-DEXTROAMPHET ER 25 MG PO CP24: 25.0000 mg | ORAL_CAPSULE | ORAL | Status: DC

## 2013-04-21 NOTE — Progress Notes (Signed)
Subjective:    Patient ID: Victoria Norton, female    DOB: 11-30-1965, 48 y.o.   MRN: [id number]   PCP: Gemma Ruan, PA-C  Chief Complaint  Patient presents with  . 6 month check up    add and anxiety    Medications, allergies, past medical history, surgical history, family history, social history and problem list reviewed and updated.  HPI  Doing better.  Seeing a therapist.  "Head zaps" are less frequent off the Lexapro. Has to take the Adderall early to reduce the increase in trouble sleeping.  Attempts to use sleep aids have caused abnormal behavior.  Feels good on her current regimen.   Review of Systems No chest pain, SOB, HA, dizziness, vision change, N/V, diarrhea, constipation, dysuria, urinary urgency or frequency, myalgias, arthralgias or rash. No thoughts of harm to herself or others.    Objective:   Physical Exam  Vitals reviewed. Constitutional: She is oriented to person, place, and time. Vital signs are normal. She appears well-developed and well-nourished. She is active and cooperative. No distress.  BP 128/86  Pulse 67  Temp(Src) 98.3 F (36.8 C) (Oral)  Resp 16  Ht 5' 7.75" (1.721 m)  Wt 240 lb 6.4 oz (109.045 kg)  BMI 36.82 kg/m2  SpO2 100%  LMP 04/05/2013  HENT:  Head: Normocephalic and atraumatic.  Right Ear: Hearing normal.  Left Ear: Hearing normal.  Eyes: Conjunctivae are normal. No scleral icterus.  Neck: Normal range of motion. Neck supple. No thyromegaly present.  Cardiovascular: Normal rate, regular rhythm and normal heart sounds.   Pulses:      Radial pulses are 2+ on the right side, and 2+ on the left side.  Pulmonary/Chest: Effort normal and breath sounds normal.  Lymphadenopathy:       Head (right side): No tonsillar, no preauricular, no posterior auricular and no occipital adenopathy present.       Head (left side): No tonsillar, no preauricular, no posterior auricular and no occipital adenopathy present.    She has no  cervical adenopathy.       Right: No supraclavicular adenopathy present.       Left: No supraclavicular adenopathy present.  Neurological: She is alert and oriented to person, place, and time. No sensory deficit.  Skin: Skin is warm, dry and intact. No rash noted. No cyanosis or erythema. Nails show no clubbing.  Psychiatric: She has a normal mood and affect.          Assessment & Plan:  1. ADD (attention deficit disorder) Controlled. Continue current treatment, with attention to taking the dose early in the morning to reduce sleep disturbance. - amphetamine-dextroamphetamine (ADDERALL XR) 25 MG 24 hr capsule; Take 1 capsule (25 mg total) by mouth every morning. May fill 30 days after date on prescription  Dispense: 30 capsule; Refill: 0 - amphetamine-dextroamphetamine (ADDERALL XR) 25 MG 24 hr capsule; Take 1 capsule (25 mg total) by mouth every morning. May fill 60 days after date on prescription  Dispense: 30 capsule; Refill: 0 - amphetamine-dextroamphetamine (ADDERALL XR) 25 MG 24 hr capsule; Take 1 capsule (25 mg total) by mouth every morning.  Dispense: 30 capsule; Refill: 0  2. Anxiety and depression Stable. Continue with psychotherapy.  Reconsider medication in the future if needed.  3. AR (allergic rhinitis) Stable.  Continue steroid nasal spray. - mometasone (NASONEX) 50 MCG/ACT nasal spray; Place 2 sprays into the nose daily.  Dispense: 17 g; Refill: 5  Return in about 6 months (around 10/22/2013).  Fara Chute, PA-C Physician Assistant-Certified [facility]

## 2013-05-27 ENCOUNTER — Ambulatory Visit (INDEPENDENT_AMBULATORY_CARE_PROVIDER_SITE_OTHER): Payer: BC Managed Care – PPO | Admitting: Physician Assistant

## 2013-05-27 VITALS — BP 132/80 | HR 68 | Temp 98.4°F | Resp 16 | Ht 67.5 in | Wt 242.8 lb

## 2013-05-27 DIAGNOSIS — L03319 Cellulitis of trunk, unspecified: Secondary | ICD-10-CM

## 2013-05-27 DIAGNOSIS — T148 Other injury of unspecified body region: Secondary | ICD-10-CM

## 2013-05-27 DIAGNOSIS — L02219 Cutaneous abscess of trunk, unspecified: Secondary | ICD-10-CM

## 2013-05-27 DIAGNOSIS — W57XXXA Bitten or stung by nonvenomous insect and other nonvenomous arthropods, initial encounter: Secondary | ICD-10-CM

## 2013-05-27 MED ORDER — DOXYCYCLINE HYCLATE 100 MG PO CAPS: 100.0000 mg | ORAL_CAPSULE | Freq: Two times a day (BID) | ORAL | Status: DC

## 2013-05-27 NOTE — Progress Notes (Signed)
   Subjective:    Patient ID: Victoria Norton, female    DOB: 02-08-1965, 48 y.o.   MRN: [id number]  HPI 48 year old female presents for evaluation of a tick bite on her left side. States she noticed the tick her on 4/27 and pulled it off. States she thinks the head was imbedded and she then "dug it out."  Has been noticing increasing redness since then.  Denies any pain, drainage, fever, chills, nausea, vomiting, or abdominal pain.  Does admit to a headache today but believes it to be related to having not eaten anything yet today after 3 sets of tennis.      Review of Systems  Constitutional: Negative for fever and chills.  Gastrointestinal: Negative for nausea, vomiting and abdominal pain.  Skin: Positive for rash.  Neurological: Positive for headaches. Negative for dizziness.       Objective:   Physical Exam  Constitutional: She is oriented to person, place, and time. She appears well-developed and well-nourished.  HENT:  Head: Normocephalic and atraumatic.  Right Ear: External ear normal.  Left Ear: External ear normal.  Eyes: Conjunctivae are normal.  Neck: Normal range of motion.  Cardiovascular: Normal rate.   Pulmonary/Chest: Effort normal.  Neurological: She is alert and oriented to person, place, and time.  Skin:     Noted area has 4.5 cm erythematous area with central scabbed wound. About a 1.5 cm surrounding area that is a lighter, erythematous color.  There are several small vesicles that are posterior to the central scab. (see scanned picture) no evidence of imbedded tick  Psychiatric: She has a normal mood and affect. Her behavior is normal. Judgment and thought content normal.          Assessment & Plan:  Cellulitis and abscess of trunk - Plan: doxycycline (VIBRAMYCIN) 100 MG capsule  Tick bite - Plan: doxycycline (VIBRAMYCIN) 100 MG capsule  Will treat with doxycycline 100 mg bid x 7 days to cover for staph infection. This will cover possible RMSF,  although I think this is unlikely. Tick bite <24hours attached, no fever/chills, N/V, or abdominal pain.  Recheck if rash spreading or becomes painful or development of any systemic sx's.

## 2013-07-28 ENCOUNTER — Telehealth: Payer: Self-pay

## 2013-07-28 DIAGNOSIS — F988 Other specified behavioral and emotional disorders with onset usually occurring in childhood and adolescence: Secondary | ICD-10-CM

## 2013-07-28 NOTE — Telephone Encounter (Signed)
Needs refill on amphetamine-dextroamphetamine (ADDERALL XR) 25 MG 24, advised her to allow for 24 to 28 hours, and we would call her when her script was ready for pick up

## 2013-07-30 MED ORDER — AMPHETAMINE-DEXTROAMPHET ER 25 MG PO CP24: 25.0000 mg | ORAL_CAPSULE | ORAL | Status: DC

## 2013-07-30 NOTE — Telephone Encounter (Signed)
rx's printed.  Meds ordered this encounter  Medications  . amphetamine-dextroamphetamine (ADDERALL XR) 25 MG 24 hr capsule    Sig: Take 1 capsule (25 mg total) by mouth every morning.    Dispense:  30 capsule    Refill:  0    Order Specific Question:  Supervising Provider    Answer:  DOOLITTLE, ROBERT P [[id number]]  . amphetamine-dextroamphetamine (ADDERALL XR) 25 MG 24 hr capsule    Sig: Take 1 capsule (25 mg total) by mouth every morning. May fill 30 days after date on prescription    Dispense:  30 capsule    Refill:  0    Order Specific Question:  Supervising Provider    Answer:  DOOLITTLE, ROBERT P [[id number]]  . amphetamine-dextroamphetamine (ADDERALL XR) 25 MG 24 hr capsule    Sig: Take 1 capsule (25 mg total) by mouth every morning. May fill 60 days after date on prescription    Dispense:  30 capsule    Refill:  0    Order Specific Question:  Supervising Provider    Answer:  DOOLITTLE, ROBERT P [[id number]]

## 2013-07-30 NOTE — Telephone Encounter (Signed)
Pt called to check status on rx.

## 2013-07-31 NOTE — Telephone Encounter (Signed)
Lm advised pt rx ready for pu

## 2013-10-20 ENCOUNTER — Ambulatory Visit: Payer: BC Managed Care – PPO | Admitting: Physician Assistant

## 2013-11-06 ENCOUNTER — Telehealth: Payer: Self-pay

## 2013-11-06 NOTE — Telephone Encounter (Signed)
Chelle: Adderall XR 25 MG, needs to pick up 3 months worth of prescriptions, please call when ready.  Best number is [phone]

## 2013-11-09 NOTE — Telephone Encounter (Signed)
Please call this patient. I last saw her in March, with plans to see her back in 6 months. What's her plan?

## 2013-11-10 NOTE — Telephone Encounter (Signed)
Lm for pt to RTC 

## 2013-12-12 ENCOUNTER — Ambulatory Visit (INDEPENDENT_AMBULATORY_CARE_PROVIDER_SITE_OTHER): Payer: BC Managed Care – PPO | Admitting: Physician Assistant

## 2013-12-12 VITALS — BP 130/76 | HR 74 | Temp 98.0°F | Resp 16 | Ht 67.5 in | Wt 217.4 lb

## 2013-12-12 DIAGNOSIS — E669 Obesity, unspecified: Secondary | ICD-10-CM

## 2013-12-12 DIAGNOSIS — F988 Other specified behavioral and emotional disorders with onset usually occurring in childhood and adolescence: Secondary | ICD-10-CM

## 2013-12-12 DIAGNOSIS — F909 Attention-deficit hyperactivity disorder, unspecified type: Secondary | ICD-10-CM

## 2013-12-12 DIAGNOSIS — Z23 Encounter for immunization: Secondary | ICD-10-CM

## 2013-12-12 MED ORDER — AMPHETAMINE-DEXTROAMPHET ER 25 MG PO CP24: 25.0000 mg | ORAL_CAPSULE | ORAL | Status: DC

## 2013-12-12 NOTE — Patient Instructions (Addendum)
Keep reading and walking and eating food!Influenza Vaccine (Flu Vaccine, Inactivated or Recombinant) 2014-2015: What You Need to Know 1. Why get vaccinated? Influenza ("flu") is a contagious disease that spreads around the [place] every winter, usually between October and May. Flu is caused by influenza viruses, and is spread mainly by coughing, sneezing, and close contact. Anyone can get flu, but the risk of getting flu is highest among children. Symptoms come on suddenly and may last several days. They can include:  fever/chills  sore throat  muscle aches  fatigue  cough  headache  runny or stuffy nose Flu can make some people much sicker than others. These people include young children, people 23 and older, pregnant women, and people with certain health conditions-such as heart, lung or kidney disease, nervous system disorders, or a weakened immune system. Flu vaccination is especially important for these people, and anyone in close contact with them. Flu can also lead to pneumonia, and make existing medical conditions worse. It can cause diarrhea and seizures in children. Each year thousands of people in the [place] die from flu, and many more are hospitalized. Flu vaccine is the best protection against flu and its complications. Flu vaccine also helps prevent spreading flu from person to person. 2. Inactivated and recombinant flu vaccines You are getting an injectable flu vaccine, which is either an "inactivated" or "recombinant" vaccine. These vaccines do not contain any live influenza virus. They are given by injection with a needle, and often called the "flu shot."  A different live, attenuated (weakened) influenza vaccine is sprayed into the nostrils. This vaccine is described in a separate Vaccine Information Statement. Flu vaccination is recommended every year. Some children 6 months through 56 years of age might need two doses during one year. Flu viruses are always  changing. Each year's flu vaccine is made to protect against 3 or 4 viruses that are likely to cause disease that year. Flu vaccine cannot prevent all cases of flu, but it is the best defense against the disease.  It takes about 2 weeks for protection to develop after the vaccination, and protection lasts several months to a year. Some illnesses that are not caused by influenza virus are often mistaken for flu. Flu vaccine will not prevent these illnesses. It can only prevent influenza. Some inactivated flu vaccine contains a very small amount of a mercury-based preservative called thimerosal. Studies have shown that thimerosal in vaccines is not harmful, but flu vaccines that do not contain a preservative are available. 3. Some people should not get this vaccine Tell the person who gives you the vaccine:  If you have any severe, life-threatening allergies. If you ever had a life-threatening allergic reaction after a dose of flu vaccine, or have a severe allergy to any part of this vaccine, including (for example) an allergy to gelatin, antibiotics, or eggs, you may be advised not to get vaccinated. Most, but not all, types of flu vaccine contain a small amount of egg protein.  If you ever had Guillain-Barr Syndrome (a severe paralyzing illness, also called GBS). Some people with a history of GBS should not get this vaccine. This should be discussed with your doctor.  If you are not feeling well. It is usually okay to get flu vaccine when you have a mild illness, but you might be advised to wait until you feel better. You should come back when you are better. 4. Risks of a vaccine reaction With a vaccine, like any medicine,  there is a chance of side effects. These are usually mild and go away on their own. Problems that could happen after any vaccine:  Brief fainting spells can happen after any medical procedure, including vaccination. Sitting or lying down for about 15 minutes can help prevent  fainting, and injuries caused by a fall. Tell your doctor if you feel dizzy, or have vision changes or ringing in the ears.  Severe shoulder pain and reduced range of motion in the arm where a shot was given can happen, very rarely, after a vaccination.  Severe allergic reactions from a vaccine are very rare, estimated at less than 1 in a million doses. If one were to occur, it would usually be within a few minutes to a few hours after the vaccination. Mild problems following inactivated flu vaccine:  soreness, redness, or swelling where the shot was given  hoarseness  sore, red or itchy eyes  cough  fever  aches  headache  itching  fatigue If these problems occur, they usually begin soon after the shot and last 1 or 2 days. Moderate problems following inactivated flu vaccine:  Young children who get inactivated flu vaccine and pneumococcal vaccine (PCV13) at the same time may be at increased risk for seizures caused by fever. Ask your doctor for more information. Tell your doctor if a child who is getting flu vaccine has ever had a seizure. Inactivated flu vaccine does not contain live flu virus, so you cannot get the flu from this vaccine. As with any medicine, there is a very remote chance of a vaccine causing a serious injury or death. The safety of vaccines is always being monitored. For more information, visit: http://www.aguilar.org/ 5. What if there is a serious reaction? What should I look for?  Look for anything that concerns you, such as signs of a severe allergic reaction, very high fever, or behavior changes. Signs of a severe allergic reaction can include hives, swelling of the face and throat, difficulty breathing, a fast heartbeat, dizziness, and weakness. These would start a few minutes to a few hours after the vaccination. What should I do?  If you think it is a severe allergic reaction or other emergency that can't wait, call 9-1-1 and get the person to the  nearest hospital. Otherwise, call your doctor.  Afterward, the reaction should be reported to the Vaccine Adverse Event Reporting System (VAERS). Your doctor should file this report, or you can do it yourself through the VAERS web site at www.vaers.SamedayNews.es, or by calling 442-145-9059. VAERS does not give medical advice. 6. The National Vaccine Injury Compensation Program The [employer] Vaccine Injury Compensation Program (VICP) is a federal program that was created to compensate people who may have been injured by certain vaccines. Persons who believe they may have been injured by a vaccine can learn about the program and about filing a claim by calling 260-041-6792 or visiting the [place] website at GoldCloset.com.ee. There is a time limit to file a claim for compensation. 7. How can I learn more?  Ask your health care provider.  Call your local or state health department.  Contact the Centers for Disease Control and Prevention (CDC):  Call 989-134-7497 (1-800-CDC-INFO) or  Visit CDC's website at https://gibson.com/ CDC Vaccine Information Statement (Interim) Inactivated Influenza Vaccine (09/16/2012) Document Released: 11/09/2005 Document Revised: 06/01/2013 Document Reviewed: 01/02/2013 [facility] Patient Information 2015 [place]. This information is not intended to replace advice given to you by your health care provider. Make sure you discuss any  questions you have with your health care provider.  

## 2013-12-12 NOTE — Progress Notes (Signed)
Subjective:    Patient ID: Victoria Norton, female    DOB: July 06, 1965, 48 y.o.   MRN: [id number]   PCP: Zaynab Chipman, PA-C  Chief Complaint  Patient presents with  . Medication Refill    aderall  . Follow-up    No Known Allergies  Patient Active Problem List   Diagnosis Date Noted  . Obesity (BMI 30-39.9) 04/21/2013  . AR (allergic rhinitis) 01/03/2012  . Anxiety and depression   . ADD (attention deficit disorder)   . Fibroadenoma of breast   . Kidney lesion   . DUB (dysfunctional uterine bleeding) 08/29/2011    Prior to Admission medications   Medication Sig Start Date End Date Taking? Authorizing Provider  amphetamine-dextroamphetamine (ADDERALL XR) 25 MG 24 hr capsule Take 1 capsule by mouth every morning. May fill 30 days after date on prescription 12/12/13  Yes Kinser Fellman S Sherill Wegener, PA-C  calcium-vitamin D (OSCAL WITH D) 500-200 MG-UNIT per tablet Take 1 tablet by mouth daily.   Yes Historical Provider, MD  cetirizine (ZYRTEC) 10 MG tablet Take 10 mg by mouth daily.   Yes Historical Provider, MD  Cholecalciferol (D-3-5) 5000 UNITS capsule Take 5,000 Units by mouth daily.   Yes Historical Provider, MD  fish oil-omega-3 fatty acids 1000 MG capsule Take 2 g by mouth daily.   Yes Historical Provider, MD  mometasone (NASONEX) 50 MCG/ACT nasal spray Place 2 sprays into the nose daily. 04/21/13 04/21/14 Yes Quetzali Heinle S Richar Dunklee, PA-C  Multiple Vitamin (MULTIVITAMIN) capsule Take 1 capsule by mouth daily.   Yes Historical Provider, MD    Medical, Surgical, Family and Social History reviewed and updated.  HPI  Presents for refills of Adderall. Doing well on the current dose. Tried to stop it, but found that she really struggled with staying focused and completing work tasks, inattentiveness and distractibility. Her therapist encouraged her to talk with me and resume it.  She's working on increasing exercise and healthy eating. Recently hit a plateau after losing about 20  pounds.  Walking for exercise (listens to interesting pod casts for entertainment). Chooses "clean" foods. No beef or pork. Minimizing processed foods. Also reduced dairy due to GI effects.  Review of Systems As above. No CP, SOB, HA, dizziness.    Objective:   Physical Exam  Constitutional: She is oriented to person, place, and time. Vital signs are normal. She appears well-developed and well-nourished. She is active and cooperative. No distress.  BP 130/76 mmHg  Pulse 74  Temp(Src) 98 F (36.7 C) (Oral)  Resp 16  Ht 5' 7.5" (1.715 m)  Wt 217 lb 6.4 oz (98.612 kg)  BMI 33.53 kg/m2  SpO2 98%  LMP 12/11/2013  HENT:  Head: Normocephalic and atraumatic.  Right Ear: Hearing normal.  Left Ear: Hearing normal.  Eyes: Conjunctivae are normal. No scleral icterus.  Neck: Normal range of motion. Neck supple. No thyromegaly present.  Cardiovascular: Normal rate, regular rhythm and normal heart sounds.   Pulses:      Radial pulses are 2+ on the right side, and 2+ on the left side.  Pulmonary/Chest: Effort normal and breath sounds normal.  Lymphadenopathy:       Head (right side): No tonsillar, no preauricular, no posterior auricular and no occipital adenopathy present.       Head (left side): No tonsillar, no preauricular, no posterior auricular and no occipital adenopathy present.    She has no cervical adenopathy.       Right: No supraclavicular adenopathy present.  Left: No supraclavicular adenopathy present.  Neurological: She is alert and oriented to person, place, and time. No sensory deficit.  Skin: Skin is warm, dry and intact. No rash noted. No cyanosis or erythema. Nails show no clubbing.  Psychiatric: She has a normal mood and affect. Her behavior is normal.  Vitals reviewed.         Assessment & Plan:  1. ADD (attention deficit disorder) Stable. Continue current treatment. Re-evaluate in 6 months, sooner if needed. - amphetamine-dextroamphetamine (ADDERALL XR) 25  MG 24 hr capsule; Take 1 capsule by mouth every morning. May fill 60 days after date on prescription  Dispense: 30 capsule; Refill: 0 - amphetamine-dextroamphetamine (ADDERALL XR) 25 MG 24 hr capsule; Take 1 capsule by mouth every morning. May fill 30 days after date on prescription  Dispense: 30 capsule; Refill: 0 - amphetamine-dextroamphetamine (ADDERALL XR) 25 MG 24 hr capsule; Take 1 capsule by mouth every morning.  Dispense: 30 capsule; Refill: 0  2. Need for influenza vaccination - Flu Vaccine QUAD 36+ mos IM  3. Obesity (BMI 30-39.9) Encouraged continued efforts for additional weight loss through healthy eating choices and regular exercise.   Fara Chute, PA-C Physician Assistant-Certified [facility]

## 2014-03-16 ENCOUNTER — Telehealth: Payer: Self-pay

## 2014-03-16 DIAGNOSIS — F988 Other specified behavioral and emotional disorders with onset usually occurring in childhood and adolescence: Secondary | ICD-10-CM

## 2014-03-16 NOTE — Telephone Encounter (Signed)
Pt called requesting a refill on brand name Adderall. She would also like 3 months at a time if possible.

## 2014-03-17 MED ORDER — AMPHETAMINE-DEXTROAMPHET ER 25 MG PO CP24: 25.0000 mg | ORAL_CAPSULE | ORAL | Status: DC

## 2014-03-17 NOTE — Telephone Encounter (Signed)
Meds ordered this encounter  Medications  . amphetamine-dextroamphetamine (ADDERALL XR) 25 MG 24 hr capsule    Sig: Take 1 capsule by mouth every morning.    Dispense:  30 capsule    Refill:  0    BRAND NAME REQUESTED.    Order Specific Question:  Supervising Provider    Answer:  DOOLITTLE, ROBERT P [[id number]]  . amphetamine-dextroamphetamine (ADDERALL XR) 25 MG 24 hr capsule    Sig: Take 1 capsule by mouth every morning. May fill 60 days after date on prescription    Dispense:  30 capsule    Refill:  0    BRAND NAME REQUESTED.    Order Specific Question:  Supervising Provider    Answer:  DOOLITTLE, ROBERT P [[id number]]  . amphetamine-dextroamphetamine (ADDERALL XR) 25 MG 24 hr capsule    Sig: Take 1 capsule by mouth every morning. May fill 30 days after date on prescription    Dispense:  30 capsule    Refill:  0    BRAND NAME REQUESTED.    Order Specific Question:  Supervising Provider    Answer:  Tami Lin P [[id number]]

## 2014-03-17 NOTE — Telephone Encounter (Signed)
Notified pt ready. 

## 2014-04-21 ENCOUNTER — Encounter: Payer: Self-pay | Admitting: Women's Health

## 2014-05-04 ENCOUNTER — Other Ambulatory Visit (HOSPITAL_COMMUNITY)
Admission: RE | Admit: 2014-05-04 | Discharge: 2014-05-04 | Disposition: A | Discharge status: Home / Self Care | Payer: 59 | Source: Ambulatory Visit | Attending: Women's Health | Admitting: Women's Health

## 2014-05-04 ENCOUNTER — Ambulatory Visit (INDEPENDENT_AMBULATORY_CARE_PROVIDER_SITE_OTHER): Payer: 59 | Admitting: Women's Health

## 2014-05-04 ENCOUNTER — Encounter: Payer: Self-pay | Admitting: Women's Health

## 2014-05-04 VITALS — BP 122/86 | Ht 68.0 in | Wt 221.4 lb

## 2014-05-04 DIAGNOSIS — N921 Excessive and frequent menstruation with irregular cycle: Secondary | ICD-10-CM | POA: Diagnosis not present

## 2014-05-04 DIAGNOSIS — Z01419 Encounter for gynecological examination (general) (routine) without abnormal findings: Secondary | ICD-10-CM | POA: Insufficient documentation

## 2014-05-04 DIAGNOSIS — Z1151 Encounter for screening for human papillomavirus (HPV): Secondary | ICD-10-CM | POA: Insufficient documentation

## 2014-05-04 DIAGNOSIS — Z13220 Encounter for screening for lipoid disorders: Secondary | ICD-10-CM

## 2014-05-04 LAB — COMPREHENSIVE METABOLIC PANEL
ALBUMIN: 4.4 g/dL (ref 3.5–5.2)
ALK PHOS: 76 U/L (ref 39–117)
ALT: 12 U/L (ref 0–35)
AST: 17 U/L (ref 0–37)
BUN: 11 mg/dL (ref 6–23)
CALCIUM: 9.4 mg/dL (ref 8.4–10.5)
CHLORIDE: 104 meq/L (ref 96–112)
CO2: 23 mEq/L (ref 19–32)
Creat: 0.6 mg/dL (ref 0.50–1.10)
GLUCOSE: 89 mg/dL (ref 70–99)
POTASSIUM: 4.2 meq/L (ref 3.5–5.3)
SODIUM: 137 meq/L (ref 135–145)
TOTAL PROTEIN: 6.7 g/dL (ref 6.0–8.3)
Total Bilirubin: 0.4 mg/dL (ref 0.2–1.2)

## 2014-05-04 LAB — LIPID PANEL
Cholesterol: 204 mg/dL — ABNORMAL HIGH (ref 0–200)
HDL: 63 mg/dL (ref >=46)
LDL CALC: 109 mg/dL — AB (ref 0–99)
TRIGLYCERIDES: 161 mg/dL — AB (ref <150)
Total CHOL/HDL Ratio: 3.2 Ratio
VLDL: 32 mg/dL (ref 0–40)

## 2014-05-04 LAB — CBC WITH DIFFERENTIAL/PLATELET
Basophils Absolute: 0 10*3/uL (ref 0.0–0.1)
Basophils Relative: 0 % (ref 0–1)
EOS PCT: 2 % (ref 0–5)
Eosinophils Absolute: 0.1 10*3/uL (ref 0.0–0.7)
HCT: 36.8 % (ref 36.0–46.0)
Hemoglobin: 12.5 g/dL (ref 12.0–15.0)
LYMPHS ABS: 2 10*3/uL (ref 0.7–4.0)
Lymphocytes Relative: 29 % (ref 12–46)
MCH: 27.5 pg (ref 26.0–34.0)
MCHC: 34 g/dL (ref 30.0–36.0)
MCV: 81.1 fL (ref 78.0–100.0)
MONOS PCT: 7 % (ref 3–12)
MPV: 9 fL (ref 8.6–12.4)
Monocytes Absolute: 0.5 10*3/uL (ref 0.1–1.0)
Neutro Abs: 4.2 10*3/uL (ref 1.7–7.7)
Neutrophils Relative %: 62 % (ref 43–77)
PLATELETS: 432 10*3/uL — AB (ref 150–400)
RBC: 4.54 MIL/uL (ref 3.87–5.11)
RDW: 13.8 % (ref 11.5–15.5)
WBC: 6.8 10*3/uL (ref 4.0–10.5)

## 2014-05-04 LAB — TSH: TSH: 1.603 u[IU]/mL (ref 0.350–4.500)

## 2014-05-04 NOTE — Patient Instructions (Signed)
Menorrhagia Menorrhagia is the medical term for when your menstrual periods are heavy or last longer than usual. With menorrhagia, every period you have may cause enough blood loss and cramping that you are unable to maintain your usual activities. CAUSES  In some cases, the cause of heavy periods is unknown, but a number of conditions may cause menorrhagia. Common causes include:  A problem with the hormone-producing thyroid gland (hypothyroid).  Noncancerous growths in the uterus (polyps or fibroids).  An imbalance of the estrogen and progesterone hormones.  One of your ovaries not releasing an egg during one or more months.  Side effects of having an intrauterine device (IUD).  Side effects of some medicines, such as anti-inflammatory medicines or blood thinners.  A bleeding disorder that stops your blood from clotting normally. SIGNS AND SYMPTOMS  During a normal period, bleeding lasts between 4 and 8 days. Signs that your periods are too heavy include:  You routinely have to change your pad or tampon every 1 or 2 hours because it is completely soaked.  You pass blood clots larger than 1 inch (2.5 cm) in size.  You have bleeding for more than 7 days.  You need to use pads and tampons at the same time because of heavy bleeding.  You need to wake up to change your pads or tampons during the night.  You have symptoms of anemia, such as tiredness, fatigue, or shortness of breath. DIAGNOSIS  Your health care provider will perform a physical exam and ask you questions about your symptoms and menstrual history. Other tests may be ordered based on what the health care provider finds during the exam. These tests can include:  Blood tests. Blood tests are used to check if you are pregnant or have hormonal changes, a bleeding or thyroid disorder, low iron levels (anemia), or other problems.  Endometrial biopsy. Your health care provider takes a sample of tissue from the inside of your  uterus to be examined under a microscope.  Pelvic ultrasound. This test uses sound waves to make a picture of your uterus, ovaries, and vagina. The pictures can show if you have fibroids or other growths.  Hysteroscopy. For this test, your health care provider will use a small telescope to look inside your uterus. Based on the results of your initial tests, your health care provider may recommend further testing. TREATMENT  Treatment may not be needed. If it is needed, your health care provider may recommend treatment with one or more medicines first. If these do not reduce bleeding enough, a surgical treatment might be an option. The best treatment for you will depend on:   Whether you need to prevent pregnancy.  Your desire to have children in the future.  The cause and severity of your bleeding.  Your opinion and personal preference.  Medicines for menorrhagia may include:  Birth control methods that use hormones. These include the pill, skin patch, vaginal ring, shots that you get every 3 months, hormonal IUD, and implant. These treatments reduce bleeding during your menstrual period.  Medicines that thicken blood and slow bleeding.  Medicines that reduce swelling, such as ibuprofen.  Medicines that contain a synthetic hormone called progestin.   Medicines that make the ovaries stop working for a short time.  You may need surgical treatment for menorrhagia if the medicines are unsuccessful. Treatment options include:  Dilation and curettage (D&C). In this procedure, your health care provider opens (dilates) your cervix and then scrapes or suctions tissue from  the lining of your uterus to reduce menstrual bleeding.  Operative hysteroscopy. This procedure uses a tiny tube with a light (hysteroscope) to view your uterine cavity and can help in the surgical removal of a polyp that may be causing heavy periods.  Endometrial ablation. Through various techniques, your health care  provider permanently destroys the entire lining of your uterus (endometrium). After endometrial ablation, most women have little or no menstrual flow. Endometrial ablation reduces your ability to become pregnant.  Endometrial resection. This surgical procedure uses an electrosurgical wire loop to remove the lining of the uterus. This procedure also reduces your ability to become pregnant.  Hysterectomy. Surgical removal of the uterus and cervix is a permanent procedure that stops menstrual periods. Pregnancy is not possible after a hysterectomy. This procedure requires anesthesia and hospitalization. HOME CARE INSTRUCTIONS   Only take over-the-counter or prescription medicines as directed by your health care provider. Take prescribed medicines exactly as directed. Do not change or switch medicines without consulting your health care provider.  Take any prescribed iron pills exactly as directed by your health care provider. Long-term heavy bleeding may result in low iron levels. Iron pills help replace the iron your body lost from heavy bleeding. Iron may cause constipation. If this becomes a problem, increase the bran, fruits, and roughage in your diet.  Do not take aspirin or medicines that contain aspirin 1 week before or during your menstrual period. Aspirin may make the bleeding worse.  If you need to change your sanitary pad or tampon more than once every 2 hours, stay in bed and rest as much as possible until the bleeding stops.  Eat well-balanced meals. Eat foods high in iron. Examples are leafy green vegetables, meat, liver, eggs, and whole grain breads and cereals. Do not try to lose weight until the abnormal bleeding has stopped and your blood iron level is back to normal. SEEK MEDICAL CARE IF:   You soak through a pad or tampon every 1 or 2 hours, and this happens every time you have a period.  You need to use pads and tampons at the same time because you are bleeding so much.  You  need to change your pad or tampon during the night.  You have a period that lasts for more than 8 days.  You pass clots bigger than 1 inch wide.  You have irregular periods that happen more or less often than once a month.  You feel dizzy or faint.  You feel very weak or tired.  You feel short of breath or feel your heart is beating too fast when you exercise.  You have nausea and vomiting or diarrhea while you are taking your medicine.  You have any problems that may be related to the medicine you are taking. SEEK IMMEDIATE MEDICAL CARE IF:   You soak through 4 or more pads or tampons in 2 hours.  You have any bleeding while you are pregnant. MAKE SURE YOU:   Understand these instructions.  Will watch your condition.  Will get help right away if you are not doing well or get worse. Document Released: 01/15/2005 Document Revised: 01/20/2013 Document Reviewed: 07/06/2012 [facility] Patient Information 2015 [place], [place]. This information is not intended to replace advice given to you by your health care provider. Make sure you discuss any questions you have with your health care provider. Health Maintenance Adopting a healthy lifestyle and getting preventive care can go a long way to promote health and wellness. Talk  with your health care provider about what schedule of regular examinations is right for you. This is a good chance for you to check in with your provider about disease prevention and staying healthy. In between checkups, there are plenty of things you can do on your own. Experts have done a lot of research about which lifestyle changes and preventive measures are most likely to keep you healthy. Ask your health care provider for more information. WEIGHT AND DIET  Eat a healthy diet  Be sure to include plenty of vegetables, fruits, low-fat dairy products, and lean protein.  Do not eat a lot of foods high in solid fats, added sugars, or salt.  Get regular exercise.  This is one of the most important things you can do for your health.  Most adults should exercise for at least 150 minutes each week. The exercise should increase your heart rate and make you sweat (moderate-intensity exercise).  Most adults should also do strengthening exercises at least twice a week. This is in addition to the moderate-intensity exercise.  Maintain a healthy weight  Body mass index (BMI) is a measurement that can be used to identify possible weight problems. It estimates body fat based on height and weight. Your health care provider can help determine your BMI and help you achieve or maintain a healthy weight.  For females 41 years of age and older:   A BMI below 18.5 is considered underweight.  A BMI of 18.5 to 24.9 is normal.  A BMI of 25 to 29.9 is considered overweight.  A BMI of 30 and above is considered obese.  Watch levels of cholesterol and blood lipids  You should start having your blood tested for lipids and cholesterol at 50 years of age, then have this test every 5 years.  You may need to have your cholesterol levels checked more often if:  Your lipid or cholesterol levels are high.  You are older than 50 years of age.  You are at high risk for heart disease.  CANCER SCREENING   Lung Cancer  Lung cancer screening is recommended for adults 60-27 years old who are at high risk for lung cancer because of a history of smoking.  A yearly low-dose CT scan of the lungs is recommended for people who:  Currently smoke.  Have quit within the past 15 years.  Have at least a 30-pack-year history of smoking. A pack year is smoking an average of one pack of cigarettes a day for 1 year.  Yearly screening should continue until it has been 15 years since you quit.  Yearly screening should stop if you develop a health problem that would prevent you from having lung cancer treatment.  Breast Cancer  Practice breast self-awareness. This means  understanding how your breasts normally appear and feel.  It also means doing regular breast self-exams. Let your health care provider know about any changes, no matter how small.  If you are in your 20s or 30s, you should have a clinical breast exam (CBE) by a health care provider every 1-3 years as part of a regular health exam.  If you are 36 or older, have a CBE every year. Also consider having a breast X-ray (mammogram) every year.  If you have a family history of breast cancer, talk to your health care provider about genetic screening.  If you are at high risk for breast cancer, talk to your health care provider about having an MRI and a mammogram every  year.  Breast cancer gene (BRCA) assessment is recommended for women who have family members with BRCA-related cancers. BRCA-related cancers include:  Breast.  Ovarian.  Tubal.  Peritoneal cancers.  Results of the assessment will determine the need for genetic counseling and BRCA1 and BRCA2 testing. Cervical Cancer Routine pelvic examinations to screen for cervical cancer are no longer recommended for nonpregnant women who are considered low risk for cancer of the pelvic organs (ovaries, uterus, and vagina) and who do not have symptoms. A pelvic examination may be necessary if you have symptoms including those associated with pelvic infections. Ask your health care provider if a screening pelvic exam is right for you.   The Pap test is the screening test for cervical cancer for women who are considered at risk.  If you had a hysterectomy for a problem that was not cancer or a condition that could lead to cancer, then you no longer need Pap tests.  If you are older than 65 years, and you have had normal Pap tests for the past 10 years, you no longer need to have Pap tests.  If you have had past treatment for cervical cancer or a condition that could lead to cancer, you need Pap tests and screening for cancer for at least 20 years  after your treatment.  If you no longer get a Pap test, assess your risk factors if they change (such as having a new sexual partner). This can affect whether you should start being screened again.  Some women have medical problems that increase their chance of getting cervical cancer. If this is the case for you, your health care provider may recommend more frequent screening and Pap tests.  The human papillomavirus (HPV) test is another test that may be used for cervical cancer screening. The HPV test looks for the virus that can cause cell changes in the cervix. The cells collected during the Pap test can be tested for HPV.  The HPV test can be used to screen women 36 years of age and older. Getting tested for HPV can extend the interval between normal Pap tests from three to five years.  An HPV test also should be used to screen women of any age who have unclear Pap test results.  After 50 years of age, women should have HPV testing as often as Pap tests.  Colorectal Cancer  This type of cancer can be detected and often prevented.  Routine colorectal cancer screening usually begins at 50 years of age and continues through 50 years of age.  Your health care provider may recommend screening at an earlier age if you have risk factors for colon cancer.  Your health care provider may also recommend using home test kits to check for hidden blood in the stool.  A small camera at the end of a tube can be used to examine your colon directly (sigmoidoscopy or colonoscopy). This is done to check for the earliest forms of colorectal cancer.  Routine screening usually begins at age 35.  Direct examination of the colon should be repeated every 5-10 years through 50 years of age. However, you may need to be screened more often if early forms of precancerous polyps or small growths are found. Skin Cancer  Check your skin from head to toe regularly.  Tell your health care provider about any new  moles or changes in moles, especially if there is a change in a mole's shape or color.  Also tell your health care provider  if you have a mole that is larger than the size of a pencil eraser.  Always use sunscreen. Apply sunscreen liberally and repeatedly throughout the day.  Protect yourself by wearing long sleeves, pants, a wide-brimmed hat, and sunglasses whenever you are outside. HEART DISEASE, DIABETES, AND HIGH BLOOD PRESSURE   Have your blood pressure checked at least every 1-2 years. High blood pressure causes heart disease and increases the risk of stroke.  If you are between 68 years and 48 years old, ask your health care provider if you should take aspirin to prevent strokes.  Have regular diabetes screenings. This involves taking a blood sample to check your fasting blood sugar level.  If you are at a normal weight and have a low risk for diabetes, have this test once every three years after 50 years of age.  If you are overweight and have a high risk for diabetes, consider being tested at a younger age or more often. PREVENTING INFECTION  Hepatitis B  If you have a higher risk for hepatitis B, you should be screened for this virus. You are considered at high risk for hepatitis B if:  You were born in a country where hepatitis B is common. Ask your health care provider which countries are considered high risk.  Your parents were born in a high-risk country, and you have not been immunized against hepatitis B (hepatitis B vaccine).  You have HIV or AIDS.  You use needles to inject street drugs.  You live with someone who has hepatitis B.  You have had sex with someone who has hepatitis B.  You get hemodialysis treatment.  You take certain medicines for conditions, including cancer, organ transplantation, and autoimmune conditions. Hepatitis C  Blood testing is recommended for:  Everyone born from [birth year] through 1966.  Anyone with known risk factors for hepatitis  C. Sexually transmitted infections (STIs)  You should be screened for sexually transmitted infections (STIs) including gonorrhea and chlamydia if:  You are sexually active and are younger than 51 years of age.  You are older than 50 years of age and your health care provider tells you that you are at risk for this type of infection.  Your sexual activity has changed since you were last screened and you are at an increased risk for chlamydia or gonorrhea. Ask your health care provider if you are at risk.  If you do not have HIV, but are at risk, it may be recommended that you take a prescription medicine daily to prevent HIV infection. This is called pre-exposure prophylaxis (PrEP). You are considered at risk if:  You are sexually active and do not regularly use condoms or know the HIV status of your partner(s).  You take drugs by injection.  You are sexually active with a partner who has HIV. Talk with your health care provider about whether you are at high risk of being infected with HIV. If you choose to begin PrEP, you should first be tested for HIV. You should then be tested every 3 months for as long as you are taking PrEP.  PREGNANCY   If you are premenopausal and you may become pregnant, ask your health care provider about preconception counseling.  If you may become pregnant, take 400 to 800 micrograms (mcg) of folic acid every day.  If you want to prevent pregnancy, talk to your health care provider about birth control (contraception). OSTEOPOROSIS AND MENOPAUSE   Osteoporosis is a disease in which the bones lose  minerals and strength with aging. This can result in serious bone fractures. Your risk for osteoporosis can be identified using a bone density scan.  If you are 2 years of age or older, or if you are at risk for osteoporosis and fractures, ask your health care provider if you should be screened.  Ask your health care provider whether you should take a calcium or  vitamin D supplement to lower your risk for osteoporosis.  Menopause may have certain physical symptoms and risks.  Hormone replacement therapy may reduce some of these symptoms and risks. Talk to your health care provider about whether hormone replacement therapy is right for you.  HOME CARE INSTRUCTIONS   Schedule regular health, dental, and eye exams.  Stay current with your immunizations.   Do not use any tobacco products including cigarettes, chewing tobacco, or electronic cigarettes.  If you are pregnant, do not drink alcohol.  If you are breastfeeding, limit how much and how often you drink alcohol.  Limit alcohol intake to no more than 1 drink per day for nonpregnant women. One drink equals 12 ounces of beer, 5 ounces of wine, or 1 ounces of hard liquor.  Do not use street drugs.  Do not share needles.  Ask your health care provider for help if you need support or information about quitting drugs.  Tell your health care provider if you often feel depressed.  Tell your health care provider if you have ever been abused or do not feel safe at home. Document Released: 07/31/2010 Document Revised: 06/01/2013 Document Reviewed: 12/17/2012 [facility] Patient Information 2015 [place], [place]. This information is not intended to replace advice given to you by your health care provider. Make sure you discuss any questions you have with your health care provider.

## 2014-05-04 NOTE — Progress Notes (Signed)
Victoria Norton 1965/09/01 [id number]    History:    Presents for annual exam.  Monthly cycle heavy flow changing super tampon every hour for first few days, last cycle lasted 14 days, usually 7-10 days. Cycle flow heavier in the past 2 years and increasing last 6 months. Contraceptives with condoms. Normal Pap and mammogram history. Weight down 15 pounds from 3 years ago/last in 2013.  Past medical history, past surgical history, family history and social history were all reviewed and documented in the EPIC chart. Has consulting business. 2 teenage sons both doing well. Husband works for [employer] in [place].  ROS:  A ROS was performed and pertinent positives and negatives are included.  Exam:  Filed Vitals:   05/04/14 1409  BP: 122/86    General appearance:  Normal Thyroid:  Symmetrical, normal in size, without palpable masses or nodularity. Respiratory  Auscultation:  Clear without wheezing or rhonchi Cardiovascular  Auscultation:  Regular rate, without rubs, murmurs or gallops  Edema/varicosities:  Not grossly evident Abdominal  Soft,nontender, without masses, guarding or rebound.  Liver/spleen:  No organomegaly noted  Hernia:  None appreciated  Skin  Inspection:  Grossly normal   Breasts: Examined lying and sitting.     Right: Without masses, retractions, discharge or axillary adenopathy.     Left: Without masses, retractions, discharge or axillary adenopathy. Gentitourinary   Inguinal/mons:  Normal without inguinal adenopathy  External genitalia:  Normal  BUS/Urethra/Skene's glands:  Normal  Vagina:  Normal  Cervix:  Normal  Uterus:   normal in size, shape and contour.  Midline and mobile  Adnexa/parametria:     Rt: Without masses or tenderness.   Lt: Without masses or tenderness.  Anus and perineum: Normal  Digital rectal exam: Normal sphincter tone without palpated masses or tenderness  Assessment/Plan:  49 y.o. MWF G2 P2 for annual exam.   Menorrhagia Obesity Blood  pressure elevated ADD, anxiety and depression-primary care manages meds  Plan: Discussed elevated blood pressure, check away from office if greater than 130/80 follow-up with primary care. SBE's, continue annual screening mammogram, calcium rich diet, vitamin D 1000 daily, increase exercise and decrease calories for weight loss encouraged. Options reviewed, sonohysterogram with Dr. Phineas Real after next cycle, Mirena IUD information reviewed. CBC, TSH, lipid panel, CMP, UA, Pap with HR HPV typing, new screening guidelines reviewed. Screening colonoscopy, [place], instructed to schedule.   Huel Cote [facility], 7:08 PM 05/04/2014

## 2014-05-07 LAB — CYTOLOGY - PAP

## 2014-06-25 ENCOUNTER — Telehealth: Payer: Self-pay

## 2014-06-25 DIAGNOSIS — F988 Other specified behavioral and emotional disorders with onset usually occurring in childhood and adolescence: Secondary | ICD-10-CM

## 2014-06-25 DIAGNOSIS — J301 Allergic rhinitis due to pollen: Secondary | ICD-10-CM

## 2014-06-25 NOTE — Telephone Encounter (Signed)
REFILLS   amphetamine-dextroamphetamine (ADDERALL XR) 25 MG 24 hr capsule mometasone (NASONEX) 50 MCG/ACT nasal spray  [facility]   [phone]

## 2014-06-29 MED ORDER — AMPHETAMINE-DEXTROAMPHET ER 25 MG PO CP24: 25.0000 mg | ORAL_CAPSULE | ORAL | Status: DC

## 2014-06-29 MED ORDER — MOMETASONE FUROATE 50 MCG/ACT NA SUSP: 2.0000 | Freq: Every day | NASAL | Status: DC

## 2014-06-29 NOTE — Telephone Encounter (Signed)
Printed at 104. I will bring to 102 after clinic.  Meds ordered this encounter  Medications  . amphetamine-dextroamphetamine (ADDERALL XR) 25 MG 24 hr capsule    Sig: Take 1 capsule by mouth every morning.    Dispense:  30 capsule    Refill:  0    BRAND NAME REQUESTED.    Order Specific Question:  Supervising Provider    Answer:  DOOLITTLE, ROBERT P [[id number]]  . mometasone (NASONEX) 50 MCG/ACT nasal spray    Sig: Place 2 sprays into the nose daily.    Dispense:  17 g    Refill:  5    Order Specific Question:  Supervising Provider    Answer:  DOOLITTLE, ROBERT P [[id number]]

## 2014-06-29 NOTE — Telephone Encounter (Signed)
Notified pt. 

## 2014-07-08 ENCOUNTER — Ambulatory Visit: Payer: 59 | Admitting: Physician Assistant

## 2014-07-22 ENCOUNTER — Encounter: Payer: Self-pay | Admitting: Physician Assistant

## 2014-07-22 ENCOUNTER — Ambulatory Visit (INDEPENDENT_AMBULATORY_CARE_PROVIDER_SITE_OTHER): Payer: 59 | Admitting: Physician Assistant

## 2014-07-22 VITALS — BP 134/83 | HR 70 | Temp 98.5°F | Resp 16 | Ht 68.0 in | Wt 222.8 lb

## 2014-07-22 DIAGNOSIS — F909 Attention-deficit hyperactivity disorder, unspecified type: Secondary | ICD-10-CM

## 2014-07-22 DIAGNOSIS — Z1211 Encounter for screening for malignant neoplasm of colon: Secondary | ICD-10-CM

## 2014-07-22 DIAGNOSIS — J309 Allergic rhinitis, unspecified: Secondary | ICD-10-CM | POA: Diagnosis not present

## 2014-07-22 DIAGNOSIS — R05 Cough: Secondary | ICD-10-CM

## 2014-07-22 DIAGNOSIS — E785 Hyperlipidemia, unspecified: Secondary | ICD-10-CM | POA: Diagnosis not present

## 2014-07-22 DIAGNOSIS — F988 Other specified behavioral and emotional disorders with onset usually occurring in childhood and adolescence: Secondary | ICD-10-CM

## 2014-07-22 DIAGNOSIS — R059 Cough, unspecified: Secondary | ICD-10-CM

## 2014-07-22 MED ORDER — AMPHETAMINE-DEXTROAMPHET ER 25 MG PO CP24: 25.0000 mg | ORAL_CAPSULE | ORAL | Status: DC

## 2014-07-22 MED ORDER — MONTELUKAST SODIUM 10 MG PO TABS: 10.0000 mg | ORAL_TABLET | Freq: Every day | ORAL | Status: DC

## 2014-07-22 MED ORDER — PANTOPRAZOLE SODIUM 40 MG PO TBEC: 40.0000 mg | DELAYED_RELEASE_TABLET | Freq: Every day | ORAL | Status: DC

## 2014-07-22 NOTE — Progress Notes (Signed)
Patient ID: MERCADIES CO, female    DOB: June 30, 1965, 49 y.o.   MRN: [id number]  PCP: Wynne Dust  Subjective:   Chief Complaint  Patient presents with  . Medication Refill    adderall  . Cough    coughing every time she eats or drinks  . post nasal drip  . sneezing  . watery eyes    HPI Presents for medication refills and evaluation of cough and allergies.  Pt needs a refill of Adderall for ADD. She takes it only when at work, and it is working well for her without adverse reactions.   Pt also states that she has had really bad allergies her whole life, they had been under control, but she can't seem to keep up with them this year. She used to take Zyrtec, but switched to Allegra this year when Zyrtec wasn't really helping. She notices her allergies acting up the most when she travels and changes environments, which she has been doing a lot recently. She experiences a dry cough that is worse at night and when she first wakes up in the morning, itchy eyes and nose, watery eyes, and post nasal drip. She has also been using Nasonex and OTC eye drops as needed, with some relief, but does not feel like it is enough. She is thinking she needs to see an Allergist.   She is also having a dry cough that she thinks is separate from her allergies. She experiences this persistent cough after eating a drinking, almost all time. She denies any GI symptoms, but thought it might be reflux and tried Tums, Tagamet, and Zantac, without relief. She is concerned that she may have an allergy to a food she eats all the time, but has not really had the chance to log what she eats and when she gets the symptoms to look for an association. She denies reflux, acid taste in mouth, N/V, trouble swallowing, and epigastric pain.    Review of Systems Constitutional: Negative.  HENT: Positive for postnasal drip and sneezing. Negative for congestion, dental problem, drooling, ear discharge, ear pain,  facial swelling, hearing loss, mouth sores, nosebleeds, rhinorrhea, sinus pressure, tinnitus, trouble swallowing and voice change.  Eyes: Positive for redness and itching. Negative for photophobia, pain, discharge and visual disturbance.  Watery eyes  Respiratory: Positive for cough. Negative for apnea, choking, chest tightness, shortness of breath, wheezing and stridor.  Cardiovascular: Negative.  Gastrointestinal: Negative.  Denies heartburn, reflux, and acid taste in mouth.  Musculoskeletal: Negative.  Skin: Negative.  Allergic/Immunologic: Positive for environmental allergies and food allergies (lactose). Negative for immunocompromised state.  Neurological: Negative.  Hematological: Negative.  Psychiatric/Behavioral: Negative.      Patient Active Problem List   Diagnosis Date Noted  . Hyperlipidemia 07/22/2014  . Obesity (BMI 30-39.9) 04/21/2013  . AR (allergic rhinitis) 01/03/2012  . Anxiety and depression   . ADD (attention deficit disorder)   . Fibroadenoma of breast   . Kidney lesion   . DUB (dysfunctional uterine bleeding) 08/29/2011     Prior to Admission medications   Medication Sig Start Date End Date Taking? Authorizing Provider  amphetamine-dextroamphetamine (ADDERALL XR) 25 MG 24 hr capsule Take 1 capsule by mouth every morning. May fill 60 days after date on prescription 03/17/14  Yes Oniel Meleski, PA-C  amphetamine-dextroamphetamine (ADDERALL XR) 25 MG 24 hr capsule Take 1 capsule by mouth every morning. May fill 30 days after date on prescription 03/17/14  Yes Harrison Mons, PA-C  amphetamine-dextroamphetamine (  ADDERALL XR) 25 MG 24 hr capsule Take 1 capsule by mouth every morning. 06/29/14 07/29/14 Yes Aubrianne Molyneux, PA-C  fexofenadine (ALLEGRA) 180 MG tablet Take 180 mg by mouth daily.   Yes Historical Provider, MD  mometasone (NASONEX) 50 MCG/ACT nasal spray Place 2 sprays into the nose daily. 06/29/14 06/29/15 Yes Cristal Howatt, PA-C  pseudoephedrine  (SUDAFED) 30 MG tablet Take 30 mg by mouth as needed for congestion.   Yes Historical Provider, MD  calcium-vitamin D (OSCAL WITH D) 500-200 MG-UNIT per tablet Take 1 tablet by mouth daily.    Historical Provider, MD  cetirizine (ZYRTEC) 10 MG tablet Take 10 mg by mouth daily.    Historical Provider, MD  Cholecalciferol (D-3-5) 5000 UNITS capsule Take 5,000 Units by mouth daily.    Historical Provider, MD  fish oil-omega-3 fatty acids 1000 MG capsule Take 2 g by mouth daily.    Historical Provider, MD  Multiple Vitamin (MULTIVITAMIN) capsule Take 1 capsule by mouth daily.    Historical Provider, MD     Allergies  Allergen Reactions  . Lactose Diarrhea       Objective:  Physical Exam  Constitutional: She is oriented to person, place, and time. Vital signs are normal. She appears well-developed and well-nourished. She is active and cooperative. No distress.  BP 134/83 mmHg  Pulse 70  Temp(Src) 98.5 F (36.9 C) (Oral)  Resp 16  Ht 5\' 8"  (1.727 m)  Wt 222 lb 12.8 oz (101.061 kg)  BMI 33.88 kg/m2  SpO2 97%  LMP 06/27/2014  HENT:  Head: Normocephalic and atraumatic.  Right Ear: Hearing normal.  Left Ear: Hearing normal.  Nose: Mucosal edema present. No nasal septal hematoma. No epistaxis. Right sinus exhibits no maxillary sinus tenderness and no frontal sinus tenderness. Left sinus exhibits no maxillary sinus tenderness and no frontal sinus tenderness.  Eyes: Conjunctivae are normal. No scleral icterus.  Neck: Normal range of motion. Neck supple. No thyromegaly present.  Cardiovascular: Normal rate, regular rhythm and normal heart sounds.   Pulses:      Radial pulses are 2+ on the right side, and 2+ on the left side.  Pulmonary/Chest: Effort normal and breath sounds normal.  Lymphadenopathy:       Head (right side): No tonsillar, no preauricular, no posterior auricular and no occipital adenopathy present.       Head (left side): No tonsillar, no preauricular, no posterior auricular  and no occipital adenopathy present.    She has no cervical adenopathy.       Right: No supraclavicular adenopathy present.       Left: No supraclavicular adenopathy present.  Neurological: She is alert and oriented to person, place, and time. No sensory deficit.  Skin: Skin is warm, dry and intact. No rash noted. No cyanosis or erythema. Nails show no clubbing.  Psychiatric: She has a normal mood and affect. Her speech is normal and behavior is normal.           Assessment & Plan:   1. Allergic rhinitis, unspecified allergic rhinitis type Uncontrolled. Continue oral antihistamine and steroid nasal spray. Add Singulair. Refer to Allergy for additional evaluation. - Ambulatory referral to Allergy - montelukast (SINGULAIR) 10 MG tablet; Take 1 tablet (10 mg total) by mouth at bedtime.  Dispense: 90 tablet; Refill: 3  2. Cough Allergy vs. LPR. Referral to allergy. Start PPI. Anticipatory guidance that it may take several months to adequately treat LPR symptoms. - Ambulatory referral to Allergy - pantoprazole (PROTONIX) 40 MG  tablet; Take 1 tablet (40 mg total) by mouth daily.  Dispense: 90 tablet; Refill: 3  3. ADD (attention deficit disorder) Controlled on current regimen. Continue. - amphetamine-dextroamphetamine (ADDERALL XR) 25 MG 24 hr capsule; Take 1 capsule by mouth every morning. May fill 60 days after date on prescription  Dispense: 30 capsule; Refill: 0 - amphetamine-dextroamphetamine (ADDERALL XR) 25 MG 24 hr capsule; Take 1 capsule by mouth every morning. May fill 30 days after date on prescription  Dispense: 30 capsule; Refill: 0 - amphetamine-dextroamphetamine (ADDERALL XR) 25 MG 24 hr capsule; Take 1 capsule by mouth every morning.  Dispense: 30 capsule; Refill: 0  4. Hyperlipidemia Reviewed recent lab results. Encouraged healthy eating and regular exercise.   5. Screening for colon cancer - Ambulatory referral to Gastroenterology   Fara Chute,  PA-C Physician Assistant-Certified [facility] .

## 2014-07-22 NOTE — Progress Notes (Signed)
Patient ID: Victoria Norton, female    DOB: 1965/04/29, 49 y.o.   MRN: [id number]  PCP: JEFFERY,CHELLE, PA-C   Subjective:  HPI Pt is a 49 y/o female presenting for medication refills, cough, and allergies.  Pt needs a refill of Adderall for ADD. She takes it only when at work, and it is working well for her without adverse reactions.   Pt also states that she has had really bad allergies her whole life, they had been under control, but she can't seem to keep up with them this year. She used to take Zyrtec, but switched to Allegra this year when Zyrtec wasn't really helping. She notices her allergies acting up the most when she travels and changes environments, which she has been doing a lot recently. She experiences a dry cough that is worse at night and when she first wakes up in the morning, itchy eyes and nose, watery eyes, and post nasal drip. She has also been using Nasonex and OTC eye drops as needed, with some relief, but does not feel like it is enough. She is thinking she needs to see an Allergist.   She is also having a dry cough that she thinks is separate from her allergies. She experiences this persistent cough after eating a drinking, almost all time. She denies any GI symptoms, but thought it might be reflux and tried Tums, Tagamet, and Zantac, without relief. She is concerned that she may have an allergy to a food she eats all the time, but has not really had the chance to log what she eats and when she gets the symptoms to look for an association. She denies reflux, acid taste in mouth, N/V, trouble swallowing, and epigastric pain.    Review of Systems  Constitutional: Negative.   HENT: Positive for postnasal drip and sneezing. Negative for congestion, dental problem, drooling, ear discharge, ear pain, facial swelling, hearing loss, mouth sores, nosebleeds, rhinorrhea, sinus pressure, tinnitus, trouble swallowing and voice change.   Eyes: Positive for redness and itching.  Negative for photophobia, pain, discharge and visual disturbance.       Watery eyes  Respiratory: Positive for cough. Negative for apnea, choking, chest tightness, shortness of breath, wheezing and stridor.   Cardiovascular: Negative.   Gastrointestinal: Negative.        Denies heartburn, reflux, and acid taste in mouth.  Musculoskeletal: Negative.   Skin: Negative.   Allergic/Immunologic: Positive for environmental allergies and food allergies (lactose). Negative for immunocompromised state.  Neurological: Negative.   Hematological: Negative.   Psychiatric/Behavioral: Negative.      Patient Active Problem List   Diagnosis Date Noted  . Hyperlipidemia 07/22/2014  . Obesity (BMI 30-39.9) 04/21/2013  . AR (allergic rhinitis) 01/03/2012  . Anxiety and depression   . ADD (attention deficit disorder)   . Fibroadenoma of breast   . Kidney lesion   . DUB (dysfunctional uterine bleeding) 08/29/2011    Past Medical History  Diagnosis Date  . ASCUS (atypical squamous cells of undetermined significance) on Pap smear 2010    Neg HPV  . Allergy   . Anxiety and depression   . ADD (attention deficit disorder)   . Fibroadenoma of breast     LEFT  . Kidney lesion     LEFT    Prior to Admission medications   Medication Sig Start Date End Date Taking? Authorizing Provider  amphetamine-dextroamphetamine (ADDERALL XR) 25 MG 24 hr capsule Take 1 capsule by mouth every morning. May fill 60  days after date on prescription 03/17/14  Yes Chelle Jeffery, PA-C  amphetamine-dextroamphetamine (ADDERALL XR) 25 MG 24 hr capsule Take 1 capsule by mouth every morning. May fill 30 days after date on prescription 03/17/14  Yes Chelle Jacqulynn Cadet, PA-C  amphetamine-dextroamphetamine (ADDERALL XR) 25 MG 24 hr capsule Take 1 capsule by mouth every morning. 06/29/14 07/29/14 Yes Chelle Jeffery, PA-C  calcium-vitamin D (OSCAL WITH D) 500-200 MG-UNIT per tablet Take 1 tablet by mouth daily.   Yes Historical Provider, MD    Cholecalciferol (D-3-5) 5000 UNITS capsule Take 5,000 Units by mouth daily.   Yes Historical Provider, MD  fexofenadine (ALLEGRA) 180 MG tablet Take 180 mg by mouth daily.   Yes Historical Provider, MD  fish oil-omega-3 fatty acids 1000 MG capsule Take 2 g by mouth daily.   Yes Historical Provider, MD  mometasone (NASONEX) 50 MCG/ACT nasal spray Place 2 sprays into the nose daily. 06/29/14 06/29/15 Yes Chelle Jeffery, PA-C  Multiple Vitamin (MULTIVITAMIN) capsule Take 1 capsule by mouth daily.   Yes Historical Provider, MD  cetirizine (ZYRTEC) 10 MG tablet Take 10 mg by mouth daily.    Historical Provider, MD    Allergies  Allergen Reactions  . Lactose Diarrhea    Past Medical, Surgical Family and Social History reviewed and updated.   Objective:   Vitals: BP 134/83 mmHg  Pulse 70  Temp(Src) 98.5 F (36.9 C) (Oral)  Resp 16  Ht 5\' 8"  (1.727 m)  Wt 222 lb 12.8 oz (101.061 kg)  BMI 33.88 kg/m2  SpO2 97%  LMP 06/27/2014   Physical Exam  Constitutional: She is oriented to person, place, and time. She appears well-developed and well-nourished. She is cooperative. No distress.  HENT:  Head: Normocephalic and atraumatic.  Right Ear: Hearing, tympanic membrane, external ear and ear canal normal.  Left Ear: Hearing, tympanic membrane, external ear and ear canal normal.  Nose: Mucosal edema present. No rhinorrhea, nose lacerations, sinus tenderness, nasal deformity, septal deviation or nasal septal hematoma. No epistaxis.  No foreign bodies. Right sinus exhibits no maxillary sinus tenderness and no frontal sinus tenderness. Left sinus exhibits no maxillary sinus tenderness and no frontal sinus tenderness.  Mouth/Throat: Uvula is midline, oropharynx is clear and moist and mucous membranes are normal.  Eyes: Lids are normal. Pupils are equal, round, and reactive to light. Right eye exhibits no discharge. Left eye exhibits no discharge. Right conjunctiva is injected (mildly). Left  conjunctiva is injected (mildly).  Fundoscopic exam:      The right eye shows red reflex.       The left eye shows red reflex.  Neck: Trachea normal and phonation normal. Neck supple. No thyroid mass and no thyromegaly present.  Cardiovascular: Normal rate, regular rhythm and normal heart sounds.  Exam reveals no gallop and no friction rub.   No murmur heard. Pulmonary/Chest: Effort normal and breath sounds normal. No respiratory distress. She has no wheezes. She has no rales.  Abdominal: Soft. Normal appearance and bowel sounds are normal. She exhibits no distension. There is no tenderness.  Lymphadenopathy:       Head (right side): No submental, no submandibular, no tonsillar, no preauricular, no posterior auricular and no occipital adenopathy present.       Head (left side): No submental, no submandibular, no tonsillar, no preauricular, no posterior auricular and no occipital adenopathy present.    She has no cervical adenopathy.  Neurological: She is alert and oriented to person, place, and time.  Skin: Skin is warm,  dry and intact. No rash noted.  Psychiatric: She has a normal mood and affect. Her speech is normal and behavior is normal. Judgment and thought content normal. Cognition and memory are normal.    Assessment & Plan:   Pernell was seen today for medication refill, cough, post nasal drip, sneezing and watery eyes.  Diagnoses and all orders for this visit:  Allergic rhinitis, unspecified allergic rhinitis type -    Uncontrolled. -     Continue current regimen and add Singulair. She has tried Singulair in the past when she first moved to [facility], but stopped taking it because she did not need it anymore. -    See Allergist and follow specialist plan.  Orders: -     Ambulatory referral to Allergy -     montelukast (SINGULAIR) 10 MG tablet; Take 1 tablet (10 mg total) by mouth at bedtime.  Cough -   Uncertain of cause. Could be Laryngopharyngeal reflux or an allergy. -   Will  try PPI to see if this helps. -    Send to allergist for further evaluation. Orders: -     Ambulatory referral to Allergy -     pantoprazole (PROTONIX) 40 MG tablet; Take 1 tablet (40 mg total) by mouth daily.  ADD (attention deficit disorder) -     Controlled, continue current regimen. Orders: -     amphetamine-dextroamphetamine (ADDERALL XR) 25 MG 24 hr capsule; Take 1 capsule by mouth every morning. May fill 60 days after date on prescription -     amphetamine-dextroamphetamine (ADDERALL XR) 25 MG 24 hr capsule; Take 1 capsule by mouth every morning. May fill 30 days after date on prescription -     amphetamine-dextroamphetamine (ADDERALL XR) 25 MG 24 hr capsule; Take 1 capsule by mouth every morning.  Hyperlipidemia       -     Lipids mildly elevated in labs taken on 05/04/14.       -     Will plan to make diet and exercise changes and do fasting labs at next physical in 6 months at her annual exam to re-evaluate.   Screening for colon cancer -     Pt recently turned 30, and has not yet had a colonoscopy. Orders: -     Ambulatory referral to Gastroenterology   Orion Mole, PA-S [facility] 07/22/2014 2:33 PM

## 2014-07-22 NOTE — Patient Instructions (Signed)
Get back on the supplements-at least the fish oil. Make the healthy eating choices and get back into regular exercise. We'll update your fasting labs in 6 months.

## 2014-08-05 ENCOUNTER — Encounter: Payer: Self-pay | Admitting: Internal Medicine

## 2014-08-10 ENCOUNTER — Telehealth: Payer: Self-pay

## 2014-08-10 NOTE — Telephone Encounter (Signed)
TOTALLY OK with me if the patient is ok with the generic.

## 2014-08-10 NOTE — Telephone Encounter (Signed)
Pharm called concerning pt's Rx for Adderall. She saw the note for Name Brand only and stated that they have been filling with generic previously, although she does see notes on Rxs starting in Feb for name brand. She is not sure if the other pharmacists have missed the notes or if pt told them to fill with generic d/t cost difference. Pharm wanted to make sure that the original req for name brand wasn't a PROVIDER preference. Chelle, it looks like the pt may have called [place] on 03/16/14 and req'd a refill of Adderall and specified name brand, but I couldn't find any other notes on why it was req'd. Pharm is also checking with pt, but wanted me to ask you if it is all right with you to continue the generic if pt agrees/prefers?

## 2014-08-11 NOTE — Telephone Encounter (Signed)
Notified pharm OK to fill with generic if OK w/pt.

## 2014-08-28 ENCOUNTER — Telehealth: Payer: Self-pay | Admitting: Family Medicine

## 2014-08-28 NOTE — Telephone Encounter (Signed)
lmom to call and reschedule her appt with Chelle in Dec.

## 2014-08-31 ENCOUNTER — Encounter: Payer: Self-pay | Admitting: Physician Assistant

## 2014-08-31 DIAGNOSIS — K219 Gastro-esophageal reflux disease without esophagitis: Secondary | ICD-10-CM | POA: Insufficient documentation

## 2014-09-20 ENCOUNTER — Telehealth: Payer: Self-pay

## 2014-09-20 NOTE — Telephone Encounter (Signed)
[facility] outpt pharm called because pt is requesting the generic Adderall instead of name brand that was req'd on Rx. Pharm stated that pt reported she did not like the generic that her prev pharm stocked, but is fine with the generic that [facility] has and it would save her a lot of money. I OKd fill w/generic if pt is requesting it.

## 2014-10-26 ENCOUNTER — Encounter: Payer: 59 | Admitting: Internal Medicine

## 2014-11-19 ENCOUNTER — Telehealth: Payer: Self-pay

## 2014-11-19 DIAGNOSIS — F988 Other specified behavioral and emotional disorders with onset usually occurring in childhood and adolescence: Secondary | ICD-10-CM

## 2014-11-19 NOTE — Telephone Encounter (Signed)
Pt req refill for Adderall 25 mg XR (50mo req).   [phone] or [phone] VM ok when ready for pick up.

## 2014-11-22 MED ORDER — AMPHETAMINE-DEXTROAMPHET ER 25 MG PO CP24: 25.0000 mg | ORAL_CAPSULE | ORAL | Status: DC

## 2014-11-22 NOTE — Telephone Encounter (Signed)
Meds ordered this encounter  Medications  . amphetamine-dextroamphetamine (ADDERALL XR) 25 MG 24 hr capsule    Sig: Take 1 capsule by mouth every morning. May fill 60 days after date on prescription    Dispense:  30 capsule    Refill:  0    BRAND NAME REQUESTED.    Order Specific Question:  Supervising Provider    Answer:  DOOLITTLE, ROBERT P [[id number]]  . amphetamine-dextroamphetamine (ADDERALL XR) 25 MG 24 hr capsule    Sig: Take 1 capsule by mouth every morning. May fill 30 days after date on prescription    Dispense:  30 capsule    Refill:  0    BRAND NAME REQUESTED.    Order Specific Question:  Supervising Provider    Answer:  DOOLITTLE, ROBERT P [[id number]]  . amphetamine-dextroamphetamine (ADDERALL XR) 25 MG 24 hr capsule    Sig: Take 1 capsule by mouth every morning.    Dispense:  30 capsule    Refill:  0    BRAND NAME REQUESTED.    Order Specific Question:  Supervising Provider    Answer:  Tami Lin P [[id number]]

## 2014-11-22 NOTE — Telephone Encounter (Signed)
Patient notified via VM that her script is available for pick up at the walk in center.

## 2014-12-08 ENCOUNTER — Ambulatory Visit (AMBULATORY_SURGERY_CENTER): Payer: Self-pay

## 2014-12-08 VITALS — Ht 68.0 in | Wt 236.0 lb

## 2014-12-08 DIAGNOSIS — Z1211 Encounter for screening for malignant neoplasm of colon: Secondary | ICD-10-CM

## 2014-12-08 MED ORDER — NA SULFATE-K SULFATE-MG SULF 17.5-3.13-1.6 GM/177ML PO SOLN: 1.0000 | Freq: Once | ORAL | Status: DC

## 2014-12-08 NOTE — Progress Notes (Signed)
No egg or soy allergies Not on home 02 No previous anesthesia complications Emmi video declined. No diet or weight loss meds 

## 2014-12-29 ENCOUNTER — Ambulatory Visit (AMBULATORY_SURGERY_CENTER): Payer: 59 | Admitting: Internal Medicine

## 2014-12-29 ENCOUNTER — Encounter: Payer: Self-pay | Admitting: Internal Medicine

## 2014-12-29 VITALS — BP 110/62 | HR 64 | Temp 97.4°F | Resp 19 | Ht 68.0 in | Wt 236.0 lb

## 2014-12-29 DIAGNOSIS — Z1211 Encounter for screening for malignant neoplasm of colon: Secondary | ICD-10-CM | POA: Diagnosis not present

## 2014-12-29 DIAGNOSIS — D124 Benign neoplasm of descending colon: Secondary | ICD-10-CM

## 2014-12-29 MED ORDER — SODIUM CHLORIDE 0.9 % IV SOLN: 500.0000 mL | INTRAVENOUS | Status: DC

## 2014-12-29 NOTE — Progress Notes (Signed)
Called to room to assist during endoscopic procedure.  Patient ID and intended procedure confirmed with present staff. Received instructions for my participation in the procedure from the performing physician.  

## 2014-12-29 NOTE — Op Note (Signed)
[facility]  [address] [place], [address]   COLONOSCOPY PROCEDURE REPORT  PATIENT: Victoria, Norton  MR#: [id number] BIRTHDATE: 09-29-1965 , 49  yrs. old GENDER: female ENDOSCOPIST: Jerene Bears, MD REFERRED [id number] Dellis Filbert, [place] PROCEDURE DATE:  12/29/2014 PROCEDURE:   Colonoscopy, screening and Colonoscopy with snare polypectomy First Screening Colonoscopy - Avg.  risk and is 50 yrs.  old or older Yes.  Prior Negative Screening - Now for repeat screening. N/A  History of Adenoma - Now for follow-up colonoscopy & has been > or = to 3 yrs.  N/A  Polyps removed today? Yes ASA CLASS:   Class II INDICATIONS:Screening for colonic neoplasia, Colorectal Neoplasm Risk Assessment for this procedure is average risk, and 1st colonoscopy. MEDICATIONS: Monitored anesthesia care and Propofol 300 mg IV  DESCRIPTION OF PROCEDURE:   After the risks benefits and alternatives of the procedure were thoroughly explained, informed consent was obtained.  The digital rectal exam revealed no rectal mass.   The [id number]  endoscope was introduced through the anus and advanced to the terminal ileum which was intubated for a short distance. No adverse events experienced.   The quality of the prep was excellent.  (Suprep was used)  The instrument was then slowly withdrawn as the colon was fully examined. Estimated blood loss is zero unless otherwise noted in this procedure report.   COLON FINDINGS: The examined terminal ileum appeared to be normal. A sessile polyp measuring 5 mm in size was found in the descending colon.  A polypectomy was performed with a cold snare.  The resection was complete, the polyp tissue was completely retrieved and sent to histology.   The examination was otherwise normal. Retroflexed views revealed external hemorrhoids. The time to cecum = 2.8 Withdrawal time = 8.0   The scope was withdrawn and the procedure completed. COMPLICATIONS: There  were no immediate complications.  ENDOSCOPIC IMPRESSION: 1.   The examined terminal ileum appeared to be normal 2.   Sessile polyp was found in the descending colon; polypectomy was performed with a cold snare 3.   The examination was otherwise normal  RECOMMENDATIONS: 1.  Await pathology results 2.  If the polyp removed today is proven to be an adenomatous (pre-cancerous) polyp, you will need a repeat colonoscopy in 5 years.  Otherwise you should continue to follow colorectal cancer screening guidelines for "routine risk" patients with colonoscopy in 10 years.  You will receive a letter within 1-2 weeks with the results of your biopsy as well as final recommendations.  Please call my office if you have not received a letter after 3 weeks.  eSigned:  Jerene Bears, MD 12/29/2014 10:12 AM  cc: The Patient, PCP

## 2014-12-29 NOTE — Progress Notes (Signed)
Patient awakening,vss,report to rn 

## 2014-12-29 NOTE — Patient Instructions (Signed)
YOU HAD AN ENDOSCOPIC PROCEDURE TODAY AT THE [facility]:   Refer to the procedure report that was given to you for any specific questions about what was found during the examination.  If the procedure report does not answer your questions, please call your gastroenterologist to clarify.  If you requested that your care partner not be given the details of your procedure findings, then the procedure report has been included in a sealed envelope for you to review at your convenience later.  YOU SHOULD EXPECT: Some feelings of bloating in the abdomen. Passage of more gas than usual.  Walking can help get rid of the air that was put into your GI tract during the procedure and reduce the bloating. If you had a lower endoscopy (such as a colonoscopy or flexible sigmoidoscopy) you may notice spotting of blood in your stool or on the toilet paper. If you underwent a bowel prep for your procedure, you may not have a normal bowel movement for a few days.  Please Note:  You might notice some irritation and congestion in your nose or some drainage.  This is from the oxygen used during your procedure.  There is no need for concern and it should clear up in a day or so.  SYMPTOMS TO REPORT IMMEDIATELY:   Following lower endoscopy (colonoscopy or flexible sigmoidoscopy):  Excessive amounts of blood in the stool  Significant tenderness or worsening of abdominal pains  Swelling of the abdomen that is new, acute  Fever of 100F or higher   For urgent or emergent issues, a gastroenterologist can be reached at any hour by calling [phone].   DIET: Your first meal following the procedure should be a small meal and then it is ok to progress to your normal diet. Heavy or fried foods are harder to digest and may make you feel nauseous or bloated.  Likewise, meals heavy in dairy and vegetables can increase bloating.  Drink plenty of fluids but you should avoid alcoholic beverages for 24  hours.  ACTIVITY:  You should plan to take it easy for the rest of today and you should NOT DRIVE or use heavy machinery until tomorrow (because of the sedation medicines used during the test).    FOLLOW UP: Our staff will call the number listed on your records the next business day following your procedure to check on you and address any questions or concerns that you may have regarding the information given to you following your procedure. If we do not reach you, we will leave a message.  However, if you are feeling well and you are not experiencing any problems, there is no need to return our call.  We will assume that you have returned to your regular daily activities without incident.  If any biopsies were taken you will be contacted by phone or by letter within the next 1-3 weeks.  Please call us at [phone] if you have not heard about the biopsies in 3 weeks.    SIGNATURES/CONFIDENTIALITY: You and/or your care partner have signed paperwork which will be entered into your electronic medical record.  These signatures attest to the fact that that the information above on your After Visit Summary has been reviewed and is understood.  Full responsibility of the confidentiality of this discharge information lies with you and/or your care-partner.  Polyp information given.  

## 2014-12-30 ENCOUNTER — Telehealth: Payer: Self-pay | Admitting: *Deleted

## 2014-12-30 NOTE — Telephone Encounter (Signed)
Number identifier, left message, follow-up  

## 2015-01-03 ENCOUNTER — Encounter: Payer: Self-pay | Admitting: Internal Medicine

## 2015-01-20 ENCOUNTER — Ambulatory Visit: Payer: 59 | Admitting: Physician Assistant

## 2015-02-03 MED FILL — DEXTROAMP-AMPHET ER 25 MG C: 25 | 30 days supply | Qty: 30 | Fill #0

## 2015-02-03 MED FILL — PANTOPRAZOLE SOD DR 40 MG T: 40 | 90 days supply | Qty: 90 | Fill #2 | Status: TO

## 2015-02-03 MED FILL — LEVOCETIRIZINE 5 MG TABLET: 5 | 90 days supply | Qty: 90 | Fill #0

## 2015-02-03 MED FILL — MONTELUKAST SOD 10 MG TAB: 10 | 90 days supply | Qty: 90 | Fill #2 | Status: TO

## 2015-02-04 DIAGNOSIS — J301 Allergic rhinitis due to pollen: Secondary | ICD-10-CM | POA: Diagnosis not present

## 2015-02-04 DIAGNOSIS — J3089 Other allergic rhinitis: Secondary | ICD-10-CM | POA: Diagnosis not present

## 2015-02-11 DIAGNOSIS — J301 Allergic rhinitis due to pollen: Secondary | ICD-10-CM | POA: Diagnosis not present

## 2015-02-11 DIAGNOSIS — J3089 Other allergic rhinitis: Secondary | ICD-10-CM | POA: Diagnosis not present

## 2015-02-21 DIAGNOSIS — J3089 Other allergic rhinitis: Secondary | ICD-10-CM | POA: Diagnosis not present

## 2015-02-21 DIAGNOSIS — K219 Gastro-esophageal reflux disease without esophagitis: Secondary | ICD-10-CM | POA: Diagnosis not present

## 2015-02-21 DIAGNOSIS — J301 Allergic rhinitis due to pollen: Secondary | ICD-10-CM | POA: Diagnosis not present

## 2015-02-21 DIAGNOSIS — R05 Cough: Secondary | ICD-10-CM | POA: Diagnosis not present

## 2015-03-01 ENCOUNTER — Ambulatory Visit (INDEPENDENT_AMBULATORY_CARE_PROVIDER_SITE_OTHER): Payer: 59 | Admitting: Physician Assistant

## 2015-03-01 ENCOUNTER — Encounter: Payer: Self-pay | Admitting: Physician Assistant

## 2015-03-01 VITALS — BP 120/90 | HR 85 | Temp 98.4°F | Resp 16 | Ht 67.5 in | Wt 238.6 lb

## 2015-03-01 DIAGNOSIS — Z23 Encounter for immunization: Secondary | ICD-10-CM

## 2015-03-01 DIAGNOSIS — F909 Attention-deficit hyperactivity disorder, unspecified type: Secondary | ICD-10-CM

## 2015-03-01 DIAGNOSIS — J3089 Other allergic rhinitis: Secondary | ICD-10-CM | POA: Diagnosis not present

## 2015-03-01 DIAGNOSIS — J301 Allergic rhinitis due to pollen: Secondary | ICD-10-CM | POA: Diagnosis not present

## 2015-03-01 DIAGNOSIS — F988 Other specified behavioral and emotional disorders with onset usually occurring in childhood and adolescence: Secondary | ICD-10-CM

## 2015-03-01 MED ORDER — AMPHETAMINE-DEXTROAMPHET ER 25 MG PO CP24: 25.0000 mg | ORAL_CAPSULE | ORAL | Status: DC

## 2015-03-01 NOTE — Progress Notes (Signed)
Patient ID: Victoria Norton, female    DOB: 1965/11/20, 50 y.o.   MRN: [id number]  PCP: [facility]  Subjective:   Chief Complaint  Patient presents with  . Follow-up  . Medication Refill    Adderall XR 25 mg    HPI Presents for evaluation of ADD.  Doing well on the current dose of Adderall. Tolerating medication without adverse effects.  Now on immunotherapy with Dr. Donneta Romberg. Is currently using pseudofed, but has recently resumed Nasonex more regularly.  Work stress has lessened after she finished a very difficult project. Needs to get back into meditation.  Review of Systems  Constitutional: Negative.   Cardiovascular: Negative.   Psychiatric/Behavioral: Negative.  Negative for suicidal ideas, self-injury and decreased concentration. Dysphoric mood: some struggle with events since election.       Patient Active Problem List   Diagnosis Date Noted  . GERD (gastroesophageal reflux disease) 08/31/2014  . Hyperlipidemia 07/22/2014  . Obesity (BMI 30-39.9) 04/21/2013  . AR (allergic rhinitis) 01/03/2012  . Anxiety and depression   . ADD (attention deficit disorder)   . Fibroadenoma of breast   . Kidney lesion   . DUB (dysfunctional uterine bleeding) 08/29/2011     Prior to Admission medications   Medication Sig Start Date End Date Taking? Authorizing Provider  amphetamine-dextroamphetamine (ADDERALL XR) 25 MG 24 hr capsule Take 1 capsule by mouth every morning. May fill 60 days after date on prescription 11/22/14  Yes Barbara Ahart Jacqulynn Cadet, PA-C  levocetirizine (XYZAL) 5 MG tablet  02/03/15  Yes Historical Provider, MD  mometasone (NASONEX) 50 MCG/ACT nasal spray Place 2 sprays into the nose daily.   Yes Historical Provider, MD  montelukast (SINGULAIR) 10 MG tablet Take 1 tablet (10 mg total) by mouth at bedtime. 07/22/14  Yes Maghan Jessee, PA-C  pantoprazole (PROTONIX) 40 MG tablet Take 1 tablet (40 mg total) by mouth daily. 07/22/14  Yes Faith Patricelli, PA-C    pseudoephedrine (SUDAFED) 30 MG tablet Take 30 mg by mouth as needed for congestion.   Yes Historical Provider, MD     Allergies  Allergen Reactions  . Lactose Diarrhea       Objective:  Physical Exam  Constitutional: She is oriented to person, place, and time. Vital signs are normal. She appears well-developed and well-nourished. She is active and cooperative. No distress.  BP 120/90 mmHg  Pulse 85  Temp(Src) 98.4 F (36.9 C) (Oral)  Resp 16  Ht 5' 7.5" (1.715 m)  Wt 238 lb 9.6 oz (108.228 kg)  BMI 36.80 kg/m2  SpO2 98%  LMP 02/22/2015  HENT:  Head: Normocephalic and atraumatic.  Right Ear: Hearing normal.  Left Ear: Hearing normal.  Eyes: Conjunctivae are normal. No scleral icterus.  Neck: Normal range of motion. Neck supple. No thyromegaly present.  Cardiovascular: Normal rate, regular rhythm and normal heart sounds.   Pulses:      Radial pulses are 2+ on the right side, and 2+ on the left side.  Pulmonary/Chest: Effort normal and breath sounds normal.  Lymphadenopathy:       Head (right side): No tonsillar, no preauricular, no posterior auricular and no occipital adenopathy present.       Head (left side): No tonsillar, no preauricular, no posterior auricular and no occipital adenopathy present.    She has no cervical adenopathy.       Right: No supraclavicular adenopathy present.       Left: No supraclavicular adenopathy present.  Neurological: She is alert and oriented  to person, place, and time. No sensory deficit.  Skin: Skin is warm, dry and intact. No rash noted. No cyanosis or erythema. Nails show no clubbing.  Psychiatric: She has a normal mood and affect. Her speech is normal and behavior is normal.           Assessment & Plan:   1. ADD (attention deficit disorder) Stable. Controlled. Continue. May call in 3 months for prescriptions, then Follow-up in 6 months. - amphetamine-dextroamphetamine (ADDERALL XR) 25 MG 24 hr capsule; Take 1 capsule by  mouth every morning.  Dispense: 30 capsule; Refill: 0 - amphetamine-dextroamphetamine (ADDERALL XR) 25 MG 24 hr capsule; Take 1 capsule by mouth every morning.  Dispense: 30 capsule; Refill: 0 - amphetamine-dextroamphetamine (ADDERALL XR) 25 MG 24 hr capsule; Take 1 capsule by mouth every morning.  Dispense: 30 capsule; Refill: 0  2. Need for influenza vaccination - Flu Vaccine QUAD 36+ mos IM   Fara Chute, PA-C Physician Assistant-Certified [facility]

## 2015-03-04 DIAGNOSIS — J3089 Other allergic rhinitis: Secondary | ICD-10-CM | POA: Diagnosis not present

## 2015-03-04 DIAGNOSIS — J301 Allergic rhinitis due to pollen: Secondary | ICD-10-CM | POA: Diagnosis not present

## 2015-03-04 MED FILL — DEXTROAMP-AMPHET ER 25 MG C: 25 | 30 days supply | Qty: 30 | Fill #0

## 2015-03-07 DIAGNOSIS — J301 Allergic rhinitis due to pollen: Secondary | ICD-10-CM | POA: Diagnosis not present

## 2015-03-07 DIAGNOSIS — J3089 Other allergic rhinitis: Secondary | ICD-10-CM | POA: Diagnosis not present

## 2015-03-11 DIAGNOSIS — J3089 Other allergic rhinitis: Secondary | ICD-10-CM | POA: Diagnosis not present

## 2015-03-11 DIAGNOSIS — J301 Allergic rhinitis due to pollen: Secondary | ICD-10-CM | POA: Diagnosis not present

## 2015-03-15 DIAGNOSIS — J301 Allergic rhinitis due to pollen: Secondary | ICD-10-CM | POA: Diagnosis not present

## 2015-03-15 DIAGNOSIS — J3089 Other allergic rhinitis: Secondary | ICD-10-CM | POA: Diagnosis not present

## 2015-03-17 DIAGNOSIS — J301 Allergic rhinitis due to pollen: Secondary | ICD-10-CM | POA: Diagnosis not present

## 2015-03-17 DIAGNOSIS — J3089 Other allergic rhinitis: Secondary | ICD-10-CM | POA: Diagnosis not present

## 2015-03-22 DIAGNOSIS — J3089 Other allergic rhinitis: Secondary | ICD-10-CM | POA: Diagnosis not present

## 2015-03-22 DIAGNOSIS — J301 Allergic rhinitis due to pollen: Secondary | ICD-10-CM | POA: Diagnosis not present

## 2015-03-25 DIAGNOSIS — J301 Allergic rhinitis due to pollen: Secondary | ICD-10-CM | POA: Diagnosis not present

## 2015-03-25 DIAGNOSIS — J3089 Other allergic rhinitis: Secondary | ICD-10-CM | POA: Diagnosis not present

## 2015-03-30 DIAGNOSIS — J3089 Other allergic rhinitis: Secondary | ICD-10-CM | POA: Diagnosis not present

## 2015-03-30 DIAGNOSIS — J301 Allergic rhinitis due to pollen: Secondary | ICD-10-CM | POA: Diagnosis not present

## 2015-04-04 DIAGNOSIS — J301 Allergic rhinitis due to pollen: Secondary | ICD-10-CM | POA: Diagnosis not present

## 2015-04-04 DIAGNOSIS — J3089 Other allergic rhinitis: Secondary | ICD-10-CM | POA: Diagnosis not present

## 2015-04-06 DIAGNOSIS — J3089 Other allergic rhinitis: Secondary | ICD-10-CM | POA: Diagnosis not present

## 2015-04-06 DIAGNOSIS — J301 Allergic rhinitis due to pollen: Secondary | ICD-10-CM | POA: Diagnosis not present

## 2015-04-06 MED FILL — ADDERALL XR 25 MG CAPSULE: 25 | 30 days supply | Qty: 30 | Fill #0

## 2015-04-12 DIAGNOSIS — J3089 Other allergic rhinitis: Secondary | ICD-10-CM | POA: Diagnosis not present

## 2015-04-12 DIAGNOSIS — J301 Allergic rhinitis due to pollen: Secondary | ICD-10-CM | POA: Diagnosis not present

## 2015-04-14 DIAGNOSIS — J3089 Other allergic rhinitis: Secondary | ICD-10-CM | POA: Diagnosis not present

## 2015-04-14 DIAGNOSIS — J301 Allergic rhinitis due to pollen: Secondary | ICD-10-CM | POA: Diagnosis not present

## 2015-04-20 DIAGNOSIS — J3089 Other allergic rhinitis: Secondary | ICD-10-CM | POA: Diagnosis not present

## 2015-04-20 DIAGNOSIS — J301 Allergic rhinitis due to pollen: Secondary | ICD-10-CM | POA: Diagnosis not present

## 2015-04-27 DIAGNOSIS — J301 Allergic rhinitis due to pollen: Secondary | ICD-10-CM | POA: Diagnosis not present

## 2015-04-27 DIAGNOSIS — J3089 Other allergic rhinitis: Secondary | ICD-10-CM | POA: Diagnosis not present

## 2015-05-03 DIAGNOSIS — J3089 Other allergic rhinitis: Secondary | ICD-10-CM | POA: Diagnosis not present

## 2015-05-03 DIAGNOSIS — J301 Allergic rhinitis due to pollen: Secondary | ICD-10-CM | POA: Diagnosis not present

## 2015-05-05 ENCOUNTER — Encounter: Payer: 59 | Admitting: Women's Health

## 2015-05-10 DIAGNOSIS — J3089 Other allergic rhinitis: Secondary | ICD-10-CM | POA: Diagnosis not present

## 2015-05-10 DIAGNOSIS — J301 Allergic rhinitis due to pollen: Secondary | ICD-10-CM | POA: Diagnosis not present

## 2015-05-10 MED FILL — PANTOPRAZOLE SOD DR 40 MG T: 40 | 90 days supply | Qty: 90 | Fill #0

## 2015-05-10 MED FILL — LEVOCETIRIZINE 5 MG TABLET: 5 | 90 days supply | Qty: 90 | Fill #0

## 2015-05-10 MED FILL — MONTELUKAST SOD 10 MG TAB: 10 | 90 days supply | Qty: 90 | Fill #0

## 2015-05-10 MED FILL — DEXTROAMP-AMPHET ER 25 MG C: 25 | 30 days supply | Qty: 30 | Fill #0

## 2015-05-18 DIAGNOSIS — J3089 Other allergic rhinitis: Secondary | ICD-10-CM | POA: Diagnosis not present

## 2015-05-18 DIAGNOSIS — J301 Allergic rhinitis due to pollen: Secondary | ICD-10-CM | POA: Diagnosis not present

## 2015-05-20 DIAGNOSIS — J3089 Other allergic rhinitis: Secondary | ICD-10-CM | POA: Diagnosis not present

## 2015-05-20 DIAGNOSIS — J301 Allergic rhinitis due to pollen: Secondary | ICD-10-CM | POA: Diagnosis not present

## 2015-05-20 DIAGNOSIS — H5213 Myopia, bilateral: Secondary | ICD-10-CM | POA: Diagnosis not present

## 2015-05-27 ENCOUNTER — Encounter: Payer: Self-pay | Admitting: Women's Health

## 2015-05-27 ENCOUNTER — Ambulatory Visit (INDEPENDENT_AMBULATORY_CARE_PROVIDER_SITE_OTHER): Payer: 59 | Admitting: Women's Health

## 2015-05-27 VITALS — BP 124/80 | Ht 67.0 in | Wt 248.0 lb

## 2015-05-27 DIAGNOSIS — Z13220 Encounter for screening for lipoid disorders: Secondary | ICD-10-CM | POA: Diagnosis not present

## 2015-05-27 DIAGNOSIS — Z01419 Encounter for gynecological examination (general) (routine) without abnormal findings: Secondary | ICD-10-CM

## 2015-05-27 DIAGNOSIS — Z1329 Encounter for screening for other suspected endocrine disorder: Secondary | ICD-10-CM

## 2015-05-27 DIAGNOSIS — J301 Allergic rhinitis due to pollen: Secondary | ICD-10-CM | POA: Diagnosis not present

## 2015-05-27 DIAGNOSIS — N920 Excessive and frequent menstruation with regular cycle: Secondary | ICD-10-CM | POA: Diagnosis not present

## 2015-05-27 DIAGNOSIS — J3089 Other allergic rhinitis: Secondary | ICD-10-CM | POA: Diagnosis not present

## 2015-05-27 LAB — TSH: TSH: 2 m[IU]/L

## 2015-05-27 LAB — CBC WITH DIFFERENTIAL/PLATELET
BASOS PCT: 0 %
Basophils Absolute: 0 cells/uL (ref 0–200)
EOS ABS: 148 {cells}/uL (ref 15–500)
Eosinophils Relative: 2 %
HCT: 34.6 % — ABNORMAL LOW (ref 35.0–45.0)
Hemoglobin: 11.3 g/dL — ABNORMAL LOW (ref 11.7–15.5)
Lymphocytes Relative: 31 %
Lymphs Abs: 2294 cells/uL (ref 850–3900)
MCH: 24.6 pg — ABNORMAL LOW (ref 27.0–33.0)
MCHC: 32.7 g/dL (ref 32.0–36.0)
MCV: 75.2 fL — AB (ref 80.0–100.0)
MONO ABS: 518 {cells}/uL (ref 200–950)
MONOS PCT: 7 %
MPV: 8.9 fL (ref 7.5–12.5)
NEUTROS ABS: 4440 {cells}/uL (ref 1500–7800)
Neutrophils Relative %: 60 %
PLATELETS: 411 10*3/uL — AB (ref 140–400)
RBC: 4.6 MIL/uL (ref 3.80–5.10)
RDW: 15.3 % — ABNORMAL HIGH (ref 11.0–15.0)
WBC: 7.4 10*3/uL (ref 3.8–10.8)

## 2015-05-27 LAB — COMPREHENSIVE METABOLIC PANEL
ALK PHOS: 88 U/L (ref 33–130)
ALT: 12 U/L (ref 6–29)
AST: 14 U/L (ref 10–35)
Albumin: 4.1 g/dL (ref 3.6–5.1)
BILIRUBIN TOTAL: 0.5 mg/dL (ref 0.2–1.2)
BUN: 12 mg/dL (ref 7–25)
CO2: 24 mmol/L (ref 20–31)
CREATININE: 0.62 mg/dL (ref 0.50–1.05)
Calcium: 9.2 mg/dL (ref 8.6–10.4)
Chloride: 102 mmol/L (ref 98–110)
Glucose, Bld: 95 mg/dL (ref 65–99)
POTASSIUM: 4.1 mmol/L (ref 3.5–5.3)
Sodium: 138 mmol/L (ref 135–146)
TOTAL PROTEIN: 6.7 g/dL (ref 6.1–8.1)

## 2015-05-27 LAB — LIPID PANEL
CHOL/HDL RATIO: 3.4 ratio (ref <=5.0)
CHOLESTEROL: 205 mg/dL — AB (ref 125–200)
HDL: 61 mg/dL (ref >=46)
LDL Cholesterol: 115 mg/dL (ref <130)
Triglycerides: 143 mg/dL (ref <150)
VLDL: 29 mg/dL (ref <30)

## 2015-05-27 NOTE — Progress Notes (Signed)
Victoria Norton 1965-09-30 [id number]    History:    Presents for annual exam.  History of heavy 10 day to 14 day cycles, was scheduled for sonohysterogram but had to cancel x2 due to work-related travel. This past year cycles have become very light, continue to last approximately 10 days, light flow using mini pad, minimal cramping. Condoms for contraception. Normal Pap and mammogram history. 1997 breast fibroadenoma. 11/2014 colon polyp adenoma repeat in 5 years.  Past medical history, past surgical history, family history and social history were all reviewed and documented in the EPIC chart. Has a consulting business. Husband works for [employer] in Engineer, technical sales. Has 2 sons ages 23 and 20 both doing well.  ROS:  A ROS was performed and pertinent positives and negatives are included.  Exam:  Filed Vitals:   05/27/15 0939  BP: 124/80    General appearance:  Normal Thyroid:  Symmetrical, normal in size, without palpable masses or nodularity. Respiratory  Auscultation:  Clear without wheezing or rhonchi Cardiovascular  Auscultation:  Regular rate, without rubs, murmurs or gallops  Edema/varicosities:  Not grossly evident Abdominal  Soft,nontender, without masses, guarding or rebound.  Liver/spleen:  No organomegaly noted  Hernia:  None appreciated  Skin  Inspection:  Grossly normal   Breasts: Examined lying and sitting.     Right: Without masses, retractions, discharge or axillary adenopathy.     Left: Without masses, retractions, discharge or axillary adenopathy. Gentitourinary   Inguinal/mons:  Normal without inguinal adenopathy  External genitalia:  Normal  BUS/Urethra/Skene's glands:  Normal  Vagina:  Normal  Cervix:  Normal  Uterus:   normal in size, shape and contour.  Midline and mobile  Adnexa/parametria:     Rt: Without masses or tenderness.   Lt: Without masses or tenderness.  Anus and perineum: Normal  Digital rectal exam: Normal sphincter tone without palpated masses or  tenderness  Assessment/Plan:  50 y.o. MWF G2 P2 for annual exam.     Perimenopausal-Past year-Cycles becoming irregular, much lighter flow lasting 10 days. Obesity ADD-primary care manages  Plan:. Ultrasound after cycle,  continue to keep menstrual record, menopause reviewed. SBE's, continue annual 3-D screening mammogram, history of dense breasts, due, will schedule. 6 or size, calcium rich diet, vitamin D 2000 daily encouraged. Reviewed importance of decreasing calories and increasing exercise for weight loss, has gained almost 20 pounds in the past year. Aware of hazards of weight gain. CBC, lipid panel, vitamin D, TSH, UA, Pap normal with negative HR HPV typing 2016, new screening guidelines reviewed.  Huel Cote WHNP, 1:09 PM 05/27/2015

## 2015-05-27 NOTE — Patient Instructions (Signed)
Health Maintenance, Female Adopting a healthy lifestyle and getting preventive care can go a long way to promote health and wellness. Talk with your health care provider about what schedule of regular examinations is right for you. This is a good chance for you to check in with your provider about disease prevention and staying healthy. In between checkups, there are plenty of things you can do on your own. Experts have done a lot of research about which lifestyle changes and preventive measures are most likely to keep you healthy. Ask your health care provider for more information. WEIGHT AND DIET  Eat a healthy diet  Be sure to include plenty of vegetables, fruits, low-fat dairy products, and lean protein.  Do not eat a lot of foods high in solid fats, added sugars, or salt.  Get regular exercise. This is one of the most important things you can do for your health.  Most adults should exercise for at least 150 minutes each week. The exercise should increase your heart rate and make you sweat (moderate-intensity exercise).  Most adults should also do strengthening exercises at least twice a week. This is in addition to the moderate-intensity exercise.  Maintain a healthy weight  Body mass index (BMI) is a measurement that can be used to identify possible weight problems. It estimates body fat based on height and weight. Your health care provider can help determine your BMI and help you achieve or maintain a healthy weight.  For females 20 years of age and older:   A BMI below 18.5 is considered underweight.  A BMI of 18.5 to 24.9 is normal.  A BMI of 25 to 29.9 is considered overweight.  A BMI of 30 and above is considered obese.  Watch levels of cholesterol and blood lipids  You should start having your blood tested for lipids and cholesterol at 51 years of age, then have this test every 5 years.  You may need to have your cholesterol levels checked more often if:  Your lipid  or cholesterol levels are high.  You are older than 50 years of age.  You are at high risk for heart disease.  CANCER SCREENING   Lung Cancer  Lung cancer screening is recommended for adults 55-80 years old who are at high risk for lung cancer because of a history of smoking.  A yearly low-dose CT scan of the lungs is recommended for people who:  Currently smoke.  Have quit within the past 15 years.  Have at least a 30-pack-year history of smoking. A pack year is smoking an average of one pack of cigarettes a day for 1 year.  Yearly screening should continue until it has been 15 years since you quit.  Yearly screening should stop if you develop a health problem that would prevent you from having lung cancer treatment.  Breast Cancer  Practice breast self-awareness. This means understanding how your breasts normally appear and feel.  It also means doing regular breast self-exams. Let your health care provider know about any changes, no matter how small.  If you are in your 20s or 30s, you should have a clinical breast exam (CBE) by a health care provider every 1-3 years as part of a regular health exam.  If you are 40 or older, have a CBE every year. Also consider having a breast X-ray (mammogram) every year.  If you have a family history of breast cancer, talk to your health care provider about genetic screening.  If you   are at high risk for breast cancer, talk to your health care provider about having an MRI and a mammogram every year.  Breast cancer gene (BRCA) assessment is recommended for women who have family members with BRCA-related cancers. BRCA-related cancers include:  Breast.  Ovarian.  Tubal.  Peritoneal cancers.  Results of the assessment will determine the need for genetic counseling and BRCA1 and BRCA2 testing. Cervical Cancer Your health care provider may recommend that you be screened regularly for cancer of the pelvic organs (ovaries, uterus, and  vagina). This screening involves a pelvic examination, including checking for microscopic changes to the surface of your cervix (Pap test). You may be encouraged to have this screening done every 3 years, beginning at age 21.  For women ages 30-65, health care providers may recommend pelvic exams and Pap testing every 3 years, or they may recommend the Pap and pelvic exam, combined with testing for human papilloma virus (HPV), every 5 years. Some types of HPV increase your risk of cervical cancer. Testing for HPV may also be done on women of any age with unclear Pap test results.  Other health care providers may not recommend any screening for nonpregnant women who are considered low risk for pelvic cancer and who do not have symptoms. Ask your health care provider if a screening pelvic exam is right for you.  If you have had past treatment for cervical cancer or a condition that could lead to cancer, you need Pap tests and screening for cancer for at least 20 years after your treatment. If Pap tests have been discontinued, your risk factors (such as having a new sexual partner) need to be reassessed to determine if screening should resume. Some women have medical problems that increase the chance of getting cervical cancer. In these cases, your health care provider may recommend more frequent screening and Pap tests. Colorectal Cancer  This type of cancer can be detected and often prevented.  Routine colorectal cancer screening usually begins at 50 years of age and continues through 51 years of age.  Your health care provider may recommend screening at an earlier age if you have risk factors for colon cancer.  Your health care provider may also recommend using home test kits to check for hidden blood in the stool.  A small camera at the end of a tube can be used to examine your colon directly (sigmoidoscopy or colonoscopy). This is done to check for the earliest forms of colorectal  cancer.  Routine screening usually begins at age 50.  Direct examination of the colon should be repeated every 5-10 years through 51 years of age. However, you may need to be screened more often if early forms of precancerous polyps or small growths are found. Skin Cancer  Check your skin from head to toe regularly.  Tell your health care provider about any new moles or changes in moles, especially if there is a change in a mole's shape or color.  Also tell your health care provider if you have a mole that is larger than the size of a pencil eraser.  Always use sunscreen. Apply sunscreen liberally and repeatedly throughout the day.  Protect yourself by wearing long sleeves, pants, a wide-brimmed hat, and sunglasses whenever you are outside. HEART DISEASE, DIABETES, AND HIGH BLOOD PRESSURE   High blood pressure causes heart disease and increases the risk of stroke. High blood pressure is more likely to develop in:  People who have blood pressure in the high end   of the normal range (130-139/85-89 mm Hg).  People who are overweight or obese.  People who are African American.  If you are 38-23 years of age, have your blood pressure checked every 3-5 years. If you are 61 years of age or older, have your blood pressure checked every year. You should have your blood pressure measured twice--once when you are at a hospital or clinic, and once when you are not at a hospital or clinic. Record the average of the two measurements. To check your blood pressure when you are not at a hospital or clinic, you can use:  An automated blood pressure machine at a pharmacy.  A home blood pressure monitor.  If you are between 45 years and 39 years old, ask your health care provider if you should take aspirin to prevent strokes.  Have regular diabetes screenings. This involves taking a blood sample to check your fasting blood sugar level.  If you are at a normal weight and have a low risk for diabetes,  have this test once every three years after 51 years of age.  If you are overweight and have a high risk for diabetes, consider being tested at a younger age or more often. PREVENTING INFECTION  Hepatitis B  If you have a higher risk for hepatitis B, you should be screened for this virus. You are considered at high risk for hepatitis B if:  You were born in a country where hepatitis B is common. Ask your health care provider which countries are considered high risk.  Your parents were born in a high-risk country, and you have not been immunized against hepatitis B (hepatitis B vaccine).  You have HIV or AIDS.  You use needles to inject street drugs.  You live with someone who has hepatitis B.  You have had sex with someone who has hepatitis B.  You get hemodialysis treatment.  You take certain medicines for conditions, including cancer, organ transplantation, and autoimmune conditions. Hepatitis C  Blood testing is recommended for:  Everyone born from [birth year] through 1966.  Anyone with known risk factors for hepatitis C. Sexually transmitted infections (STIs)  You should be screened for sexually transmitted infections (STIs) including gonorrhea and chlamydia if:  You are sexually active and are younger than 51 years of age.  You are older than 51 years of age and your health care provider tells you that you are at risk for this type of infection.  Your sexual activity has changed since you were last screened and you are at an increased risk for chlamydia or gonorrhea. Ask your health care provider if you are at risk.  If you do not have HIV, but are at risk, it may be recommended that you take a prescription medicine daily to prevent HIV infection. This is called pre-exposure prophylaxis (PrEP). You are considered at risk if:  You are sexually active and do not regularly use condoms or know the HIV status of your partner(s).  You take drugs by injection.  You are sexually  active with a partner who has HIV. Talk with your health care provider about whether you are at high risk of being infected with HIV. If you choose to begin PrEP, you should first be tested for HIV. You should then be tested every 3 months for as long as you are taking PrEP.  PREGNANCY   If you are premenopausal and you may become pregnant, ask your health care provider about preconception counseling.  If you may  become pregnant, take 400 to 800 micrograms (mcg) of folic acid every day.  If you want to prevent pregnancy, talk to your health care provider about birth control (contraception). OSTEOPOROSIS AND MENOPAUSE   Osteoporosis is a disease in which the bones lose minerals and strength with aging. This can result in serious bone fractures. Your risk for osteoporosis can be identified using a bone density scan.  If you are 61 years of age or older, or if you are at risk for osteoporosis and fractures, ask your health care provider if you should be screened.  Ask your health care provider whether you should take a calcium or vitamin D supplement to lower your risk for osteoporosis.  Menopause may have certain physical symptoms and risks.  Hormone replacement therapy may reduce some of these symptoms and risks. Talk to your health care provider about whether hormone replacement therapy is right for you.  HOME CARE INSTRUCTIONS   Schedule regular health, dental, and eye exams.  Stay current with your immunizations.   Do not use any tobacco products including cigarettes, chewing tobacco, or electronic cigarettes.  If you are pregnant, do not drink alcohol.  If you are breastfeeding, limit how much and how often you drink alcohol.  Limit alcohol intake to no more than 1 drink per day for nonpregnant women. One drink equals 12 ounces of beer, 5 ounces of wine, or 1 ounces of hard liquor.  Do not use street drugs.  Do not share needles.  Ask your health care provider for help if  you need support or information about quitting drugs.  Tell your health care provider if you often feel depressed.  Tell your health care provider if you have ever been abused or do not feel safe at home.   This information is not intended to replace advice given to you by your health care provider. Make sure you discuss any questions you have with your health care provider.   Document Released: 07/31/2010 Document Revised: 02/05/2014 Document Reviewed: 12/17/2012 Elsevier Interactive Patient Education [employer].

## 2015-05-28 LAB — URINALYSIS W MICROSCOPIC + REFLEX CULTURE
BACTERIA UA: NONE SEEN [HPF]
BILIRUBIN URINE: NEGATIVE
CRYSTALS: NONE SEEN [HPF]
Casts: NONE SEEN [LPF]
GLUCOSE, UA: NEGATIVE
HGB URINE DIPSTICK: NEGATIVE
KETONES UR: NEGATIVE
LEUKOCYTES UA: NEGATIVE
Nitrite: NEGATIVE
PROTEIN: NEGATIVE
RBC / HPF: NONE SEEN RBC/HPF (ref <=2)
Specific Gravity, Urine: 1.026 (ref 1.001–1.035)
pH: 6 (ref 5.0–8.0)

## 2015-05-28 LAB — VITAMIN D 25 HYDROXY (VIT D DEFICIENCY, FRACTURES): Vit D, 25-Hydroxy: 16 ng/mL — ABNORMAL LOW (ref 30–100)

## 2015-05-29 LAB — URINE CULTURE

## 2015-05-30 ENCOUNTER — Other Ambulatory Visit: Payer: Self-pay | Admitting: Women's Health

## 2015-05-30 DIAGNOSIS — E559 Vitamin D deficiency, unspecified: Secondary | ICD-10-CM

## 2015-05-30 MED ORDER — VITAMIN D (ERGOCALCIFEROL) 1.25 MG (50000 UNIT) PO CAPS: ORAL_CAPSULE | ORAL | Status: DC

## 2015-05-30 MED FILL — VIT D2 1.25 MG (50,000 UNIT: 1.25 MG | 84 days supply | Qty: 12 | Fill #0

## 2015-06-08 DIAGNOSIS — J301 Allergic rhinitis due to pollen: Secondary | ICD-10-CM | POA: Diagnosis not present

## 2015-06-08 DIAGNOSIS — J3089 Other allergic rhinitis: Secondary | ICD-10-CM | POA: Diagnosis not present

## 2015-06-09 ENCOUNTER — Telehealth: Payer: Self-pay

## 2015-06-09 DIAGNOSIS — F988 Other specified behavioral and emotional disorders with onset usually occurring in childhood and adolescence: Secondary | ICD-10-CM

## 2015-06-09 NOTE — Telephone Encounter (Signed)
Pt is needing a refill on adderall and it needs to be name brand

## 2015-06-10 MED ORDER — AMPHETAMINE-DEXTROAMPHET ER 25 MG PO CP24: 25.0000 mg | ORAL_CAPSULE | ORAL | Status: DC

## 2015-06-10 NOTE — Telephone Encounter (Signed)
Advised on voicemail

## 2015-06-10 NOTE — Telephone Encounter (Signed)
Meds ordered this encounter  Medications  . amphetamine-dextroamphetamine (ADDERALL XR) 25 MG 24 hr capsule    Sig: Take 1 capsule by mouth every morning.    Dispense:  30 capsule    Refill:  0    BRAND NAME ADDERALL    Order Specific Question:  Supervising Provider    Answer:  DOOLITTLE, ROBERT P [id number]

## 2015-06-14 DIAGNOSIS — J301 Allergic rhinitis due to pollen: Secondary | ICD-10-CM | POA: Diagnosis not present

## 2015-06-14 DIAGNOSIS — J3089 Other allergic rhinitis: Secondary | ICD-10-CM | POA: Diagnosis not present

## 2015-06-14 MED FILL — ADDERALL XR 25 MG CAPSULE: 25 | 30 days supply | Qty: 30 | Fill #0

## 2015-06-30 DIAGNOSIS — J3089 Other allergic rhinitis: Secondary | ICD-10-CM | POA: Diagnosis not present

## 2015-06-30 DIAGNOSIS — J301 Allergic rhinitis due to pollen: Secondary | ICD-10-CM | POA: Diagnosis not present

## 2015-07-14 ENCOUNTER — Other Ambulatory Visit: Payer: Self-pay

## 2015-07-14 ENCOUNTER — Telehealth: Payer: Self-pay

## 2015-07-14 DIAGNOSIS — F988 Other specified behavioral and emotional disorders with onset usually occurring in childhood and adolescence: Secondary | ICD-10-CM

## 2015-07-14 NOTE — Telephone Encounter (Signed)
Pt is needing a refill on her adderall xr and would like name brand and also in a 3 month prescriptions  Best number [phone]

## 2015-07-14 NOTE — Telephone Encounter (Signed)
Pt is needing on refill of adderall xr name brand and 3 mth suppy  Best number [phone]

## 2015-07-15 DIAGNOSIS — J3089 Other allergic rhinitis: Secondary | ICD-10-CM | POA: Diagnosis not present

## 2015-07-15 DIAGNOSIS — J301 Allergic rhinitis due to pollen: Secondary | ICD-10-CM | POA: Diagnosis not present

## 2015-07-15 MED ORDER — AMPHETAMINE-DEXTROAMPHET ER 25 MG PO CP24: 25.0000 mg | ORAL_CAPSULE | ORAL | Status: DC

## 2015-07-15 NOTE — Telephone Encounter (Signed)
I only pended one mos RF because it looks like pt is due for f/up after that.

## 2015-07-15 NOTE — Telephone Encounter (Signed)
Meds ordered this encounter  Medications  . amphetamine-dextroamphetamine (ADDERALL XR) 25 MG 24 hr capsule    Sig: Take 1 capsule by mouth every morning.    Dispense:  30 capsule    Refill:  0    BRAND NAME ADDERALL    Please advise the patient that she needs OV for more.

## 2015-07-16 NOTE — Telephone Encounter (Signed)
Prescription is ready for pickup. 

## 2015-07-21 DIAGNOSIS — J3089 Other allergic rhinitis: Secondary | ICD-10-CM | POA: Diagnosis not present

## 2015-07-21 DIAGNOSIS — J301 Allergic rhinitis due to pollen: Secondary | ICD-10-CM | POA: Diagnosis not present

## 2015-07-21 MED FILL — ADDERALL XR 25 MG CAPSULE: 25 | 30 days supply | Qty: 30 | Fill #0

## 2015-07-26 DIAGNOSIS — J3089 Other allergic rhinitis: Secondary | ICD-10-CM | POA: Diagnosis not present

## 2015-07-26 DIAGNOSIS — J301 Allergic rhinitis due to pollen: Secondary | ICD-10-CM | POA: Diagnosis not present

## 2015-07-29 DIAGNOSIS — J301 Allergic rhinitis due to pollen: Secondary | ICD-10-CM | POA: Diagnosis not present

## 2015-07-29 DIAGNOSIS — J3089 Other allergic rhinitis: Secondary | ICD-10-CM | POA: Diagnosis not present

## 2015-08-09 DIAGNOSIS — J301 Allergic rhinitis due to pollen: Secondary | ICD-10-CM | POA: Diagnosis not present

## 2015-08-09 DIAGNOSIS — J3089 Other allergic rhinitis: Secondary | ICD-10-CM | POA: Diagnosis not present

## 2015-08-13 MED FILL — LEVOCETIRIZINE 5 MG TABLET: 5 | 90 days supply | Qty: 90 | Fill #1

## 2015-08-15 ENCOUNTER — Other Ambulatory Visit: Payer: Self-pay | Admitting: Physician Assistant

## 2015-08-15 MED FILL — MONTELUKAST SOD 10 MG TAB: 10 | 90 days supply | Qty: 90 | Fill #0

## 2015-08-16 MED FILL — PANTOPRAZOLE SOD DR 40 MG T: 40 | 30 days supply | Qty: 30 | Fill #0

## 2015-08-23 ENCOUNTER — Ambulatory Visit (INDEPENDENT_AMBULATORY_CARE_PROVIDER_SITE_OTHER): Payer: 59 | Admitting: Physician Assistant

## 2015-08-23 ENCOUNTER — Encounter: Payer: Self-pay | Admitting: Physician Assistant

## 2015-08-23 VITALS — BP 124/80 | HR 66 | Temp 97.8°F | Resp 18 | Ht 67.0 in | Wt 248.0 lb

## 2015-08-23 DIAGNOSIS — D649 Anemia, unspecified: Secondary | ICD-10-CM | POA: Diagnosis not present

## 2015-08-23 DIAGNOSIS — F909 Attention-deficit hyperactivity disorder, unspecified type: Secondary | ICD-10-CM | POA: Diagnosis not present

## 2015-08-23 DIAGNOSIS — J3089 Other allergic rhinitis: Secondary | ICD-10-CM | POA: Diagnosis not present

## 2015-08-23 DIAGNOSIS — F988 Other specified behavioral and emotional disorders with onset usually occurring in childhood and adolescence: Secondary | ICD-10-CM

## 2015-08-23 DIAGNOSIS — E559 Vitamin D deficiency, unspecified: Secondary | ICD-10-CM | POA: Insufficient documentation

## 2015-08-23 DIAGNOSIS — J301 Allergic rhinitis due to pollen: Secondary | ICD-10-CM | POA: Diagnosis not present

## 2015-08-23 LAB — CBC WITH DIFFERENTIAL/PLATELET
BASOS ABS: 0 {cells}/uL (ref 0–200)
BASOS PCT: 0 %
EOS ABS: 64 {cells}/uL (ref 15–500)
Eosinophils Relative: 1 %
HEMATOCRIT: 42.6 % (ref 35.0–45.0)
Hemoglobin: 14.5 g/dL (ref 11.7–15.5)
LYMPHS PCT: 30 %
Lymphs Abs: 1920 cells/uL (ref 850–3900)
MCH: 27.7 pg (ref 27.0–33.0)
MCHC: 34 g/dL (ref 32.0–36.0)
MCV: 81.5 fL (ref 80.0–100.0)
MONO ABS: 448 {cells}/uL (ref 200–950)
MONOS PCT: 7 %
MPV: 8.8 fL (ref 7.5–12.5)
NEUTROS PCT: 62 %
Neutro Abs: 3968 cells/uL (ref 1500–7800)
PLATELETS: 357 10*3/uL (ref 140–400)
RBC: 5.23 MIL/uL — ABNORMAL HIGH (ref 3.80–5.10)
RDW: 16.1 % — AB (ref 11.0–15.0)
WBC: 6.4 10*3/uL (ref 3.8–10.8)

## 2015-08-23 MED ORDER — AMPHETAMINE-DEXTROAMPHET ER 25 MG PO CP24: 25.0000 mg | ORAL_CAPSULE | ORAL | 0 refills | Status: DC

## 2015-08-23 MED FILL — ADDERALL XR 25 MG CAPSULE: 25 | 30 days supply | Qty: 30 | Fill #0

## 2015-08-23 NOTE — Progress Notes (Signed)
Subjective:    Patient ID: Victoria Norton, female    DOB: [birth year], 50 y.o.   MRN: [id number]  HPI  Victoria Norton presents for f/u today on ADD, and needs a refill of her Adderall prescription. Denies any change in appetite, chest pain, palpitations, abdominal pain, sleep disturbance. She has had some weight gain recently, but is planning to get back on track with dieting and exercise. She has had some family stressors recently, but is planning to start seeing a new therapist.  GYN gave her vitamin D supplement to take 3 mos ago. She has finished her vitamin D. Also started taking iron because she was found to be anemic. She has also been having abnormal periods and has a transvaginal US scheduled with her GYN.   Review of Systems  Constitutional: Positive for fatigue. Negative for fever.  HENT: Negative.   Eyes: Negative.   Respiratory: Negative.   Gastrointestinal: Negative for constipation, diarrhea, nausea and vomiting.  Endocrine: Negative.   Genitourinary: Positive for menstrual problem. Negative for difficulty urinating and dysuria.  Musculoskeletal: Negative.   Skin: Negative.   Allergic/Immunologic:       Allergy injections x7 months  Neurological: Negative.   Hematological: Negative.   Psychiatric/Behavioral: Negative for decreased concentration and dysphoric mood. The patient is not nervous/anxious and is not hyperactive.     Current Outpatient Prescriptions:  .  amphetamine-dextroamphetamine (ADDERALL XR) 25 MG 24 hr capsule, Take 1 capsule by mouth every morning., Disp: 30 capsule, Rfl: 0 .  ferrous sulfate 325 (65 FE) MG EC tablet, Take 325 mg by mouth 3 (three) times daily with meals., Disp: , Rfl:  .  levocetirizine (XYZAL) 5 MG tablet, , Disp: , Rfl: 2 .  mometasone (NASONEX) 50 MCG/ACT nasal spray, Place 2 sprays into the nose daily., Disp: , Rfl:  .  montelukast (SINGULAIR) 10 MG tablet, Take 1 tablet (10 mg total) by mouth at bedtime., Disp: 90 tablet, Rfl: 3 .   pantoprazole (PROTONIX) 40 MG tablet, TAKE 1 TABLET BY MOUTH DAILY., Disp: 30 tablet, Rfl: 0 .  Vitamin D, Ergocalciferol, (DRISDOL) 50000 units CAPS capsule, Take one tablet by mouth weekly for 12 weeks then schedule lab appointment to have vitamin level rechecked. (Patient not taking: Reported on 08/23/2015), Disp: 12 capsule, Rfl: 0   Allergies  Allergen Reactions  . Lactose Diarrhea       Objective:   Physical Exam  Constitutional: She is oriented to person, place, and time. Vital signs are normal. She appears well-developed and well-nourished. She is cooperative. No distress.  Blood pressure 124/80, pulse 66, temperature 97.8 F (36.6 C), temperature source Oral, resp. rate 18, height 5\' 7"  (1.702 m), weight 248 lb (112.5 kg), last menstrual period 08/18/2015, SpO2 97 %.  HENT:  Head: Normocephalic and atraumatic.  Neck: Trachea normal and normal range of motion. Neck supple. No thyromegaly present.  Cardiovascular: Normal rate, regular rhythm, normal heart sounds and intact distal pulses.   Pulmonary/Chest: Effort normal and breath sounds normal.  Abdominal: Soft. There is no tenderness.  Musculoskeletal: Normal range of motion.  Lymphadenopathy:    She has no cervical adenopathy.  Neurological: She is alert and oriented to person, place, and time. She has normal strength. No sensory deficit.  Skin: Skin is warm, dry and intact.  Psychiatric: She has a normal mood and affect. Her speech is normal and behavior is normal.       Assessment & Plan:  1. ADD - 3 prescriptions given each  for one month adderall XR 25 mg PO, 30 tabs - call for refills in 3 mos - f/u in 6 mos  2. Anemia - CBC  Kelly Rayburn PA-S 08/23/15

## 2015-08-23 NOTE — Patient Instructions (Signed)
     IF you received an x-ray today, you will receive an invoice from [facility]. Please contact [facility] at [phone] with questions or concerns regarding your invoice.   IF you received labwork today, you will receive an invoice from [facility]. Please contact [facility] at [phone] with questions or concerns regarding your invoice.   Our billing staff will not be able to assist you with questions regarding bills from these companies.  You will be contacted with the lab results as soon as they are available. The fastest way to get your results is to activate your My Chart account. Instructions are located on the last page of this paperwork. If you have not heard from us regarding the results in 2 weeks, please contact this office.      

## 2015-08-23 NOTE — Progress Notes (Signed)
Patient ID: Victoria Norton, female    DOB: Aug 14, 1965, 50 y.o.   MRN: [id number]  PCP: Harrison Mons, PA-C  Subjective:   Chief Complaint  Patient presents with  . Follow-up    HPI Presents for evaluation of ADD.  Doing well with the current dose. No appetite changes, belly pain or difficulty sleeping. Planning to resume healthy lifestyle changes, noting increase in her weight.  GYN diagnosed low vitamin D and anemia, and prescribed supplementation for both. Transvaginal US scheduled to evaluate abnormal periods..    Review of Systems Constitutional: Positive for fatigue. Negative for fever.  HENT: Negative.   Eyes: Negative.   Respiratory: Negative.   Gastrointestinal: Negative for constipation, diarrhea, nausea and vomiting.  Endocrine: Negative.   Genitourinary: Positive for menstrual problem. Negative for difficulty urinating and dysuria.  Musculoskeletal: Negative.   Skin: Negative.   Allergic/Immunologic:       Allergy injections x7 months  Neurological: Negative.   Hematological: Negative.   Psychiatric/Behavioral: Negative for decreased concentration and dysphoric mood. The patient is not nervous/anxious and is not hyperactive.      Patient Active Problem List   Diagnosis Date Noted  . Anemia 08/23/2015  . Vitamin D deficiency 08/23/2015  . GERD (gastroesophageal reflux disease) 08/31/2014  . Hyperlipidemia 07/22/2014  . Obesity (BMI 30-39.9) 04/21/2013  . AR (allergic rhinitis) 01/03/2012  . Anxiety and depression   . ADD (attention deficit disorder)   . Fibroadenoma of breast   . Kidney lesion   . DUB (dysfunctional uterine bleeding) 08/29/2011     Prior to Admission medications   Medication Sig Start Date End Date Taking? Authorizing Provider  amphetamine-dextroamphetamine (ADDERALL XR) 25 MG 24 hr capsule Take 1 capsule by mouth every morning. 07/15/15  Yes Jazara Swiney, PA-C  ferrous sulfate 325 (65 FE) MG EC tablet Take 325 mg by mouth  3 (three) times daily with meals.   Yes Historical Provider, MD  levocetirizine (XYZAL) 5 MG tablet  02/03/15  Yes Historical Provider, MD  mometasone (NASONEX) 50 MCG/ACT nasal spray Place 2 sprays into the nose daily.   Yes Historical Provider, MD  montelukast (SINGULAIR) 10 MG tablet Take 1 tablet (10 mg total) by mouth at bedtime. 07/22/14  Yes Enda Santo, PA-C  pantoprazole (PROTONIX) 40 MG tablet TAKE 1 TABLET BY MOUTH DAILY. 08/16/15  Yes Mardell Cragg, PA-C  Vitamin D, Ergocalciferol, (DRISDOL) 50000 units CAPS capsule Take one tablet by mouth weekly for 12 weeks then schedule lab appointment to have vitamin level rechecked. Patient not taking: Reported on 08/23/2015 05/30/15   Huel Cote, NP     Allergies  Allergen Reactions  . Lactose Diarrhea       Objective:  Physical Exam  Constitutional: She is oriented to person, place, and time. She appears well-developed and well-nourished. She is active and cooperative. No distress.  BP 124/80   Pulse 66   Temp 97.8 F (36.6 C) (Oral)   Resp 18   Ht 5\' 7"  (1.702 m)   Wt 248 lb (112.5 kg)   LMP 08/18/2015   SpO2 97%   BMI 38.84 kg/m   HENT:  Head: Normocephalic and atraumatic.  Right Ear: Hearing normal.  Left Ear: Hearing normal.  Eyes: Conjunctivae are normal. No scleral icterus.  Neck: Normal range of motion. Neck supple. No thyromegaly present.  Cardiovascular: Normal rate, regular rhythm and normal heart sounds.   Pulses:      Radial pulses are 2+ on the right side, and  2+ on the left side.  Pulmonary/Chest: Effort normal and breath sounds normal.  Lymphadenopathy:       Head (right side): No tonsillar, no preauricular, no posterior auricular and no occipital adenopathy present.       Head (left side): No tonsillar, no preauricular, no posterior auricular and no occipital adenopathy present.    She has no cervical adenopathy.       Right: No supraclavicular adenopathy present.       Left: No supraclavicular  adenopathy present.  Neurological: She is alert and oriented to person, place, and time. No sensory deficit.  Skin: Skin is warm, dry and intact. No rash noted. No cyanosis or erythema. Nails show no clubbing.  Psychiatric: She has a normal mood and affect. Her speech is normal and behavior is normal.           Assessment & Plan:   1. ADD (attention deficit disorder) Stable. Controlled. Continue current treatment. - amphetamine-dextroamphetamine (ADDERALL XR) 25 MG 24 hr capsule; Take 1 capsule by mouth every morning.  Dispense: 30 capsule; Refill: 0 - amphetamine-dextroamphetamine (ADDERALL XR) 25 MG 24 hr capsule; Take 1 capsule by mouth every morning.  Dispense: 30 capsule; Refill: 0 - amphetamine-dextroamphetamine (ADDERALL XR) 25 MG 24 hr capsule; Take 1 capsule by mouth every morning.  Dispense: 30 capsule; Refill: 0  2. Anemia, unspecified anemia type Update CBC. Vitamin D updated by GYN last week. - CBC with Differential/Platelet   Fara Chute, PA-C Physician Assistant-Certified [facility]

## 2015-09-05 DIAGNOSIS — J301 Allergic rhinitis due to pollen: Secondary | ICD-10-CM | POA: Diagnosis not present

## 2015-09-05 DIAGNOSIS — J3089 Other allergic rhinitis: Secondary | ICD-10-CM | POA: Diagnosis not present

## 2015-09-13 DIAGNOSIS — J3089 Other allergic rhinitis: Secondary | ICD-10-CM | POA: Diagnosis not present

## 2015-09-13 DIAGNOSIS — J301 Allergic rhinitis due to pollen: Secondary | ICD-10-CM | POA: Diagnosis not present

## 2015-09-19 DIAGNOSIS — J3089 Other allergic rhinitis: Secondary | ICD-10-CM | POA: Diagnosis not present

## 2015-09-19 DIAGNOSIS — J301 Allergic rhinitis due to pollen: Secondary | ICD-10-CM | POA: Diagnosis not present

## 2015-09-22 DIAGNOSIS — J3089 Other allergic rhinitis: Secondary | ICD-10-CM | POA: Diagnosis not present

## 2015-09-22 DIAGNOSIS — J301 Allergic rhinitis due to pollen: Secondary | ICD-10-CM | POA: Diagnosis not present

## 2015-09-29 DIAGNOSIS — J301 Allergic rhinitis due to pollen: Secondary | ICD-10-CM | POA: Diagnosis not present

## 2015-09-29 DIAGNOSIS — J3089 Other allergic rhinitis: Secondary | ICD-10-CM | POA: Diagnosis not present

## 2015-10-06 DIAGNOSIS — J301 Allergic rhinitis due to pollen: Secondary | ICD-10-CM | POA: Diagnosis not present

## 2015-10-06 DIAGNOSIS — J3089 Other allergic rhinitis: Secondary | ICD-10-CM | POA: Diagnosis not present

## 2015-10-07 ENCOUNTER — Other Ambulatory Visit: Payer: Self-pay | Admitting: Physician Assistant

## 2015-10-07 MED FILL — ADDERALL XR 25 MG CAPSULE: 25 | 30 days supply | Qty: 30 | Fill #0

## 2015-10-13 DIAGNOSIS — J301 Allergic rhinitis due to pollen: Secondary | ICD-10-CM | POA: Diagnosis not present

## 2015-10-13 DIAGNOSIS — J3089 Other allergic rhinitis: Secondary | ICD-10-CM | POA: Diagnosis not present

## 2015-11-04 DIAGNOSIS — J301 Allergic rhinitis due to pollen: Secondary | ICD-10-CM | POA: Diagnosis not present

## 2015-11-04 DIAGNOSIS — J3089 Other allergic rhinitis: Secondary | ICD-10-CM | POA: Diagnosis not present

## 2015-11-22 MED FILL — PANTOPRAZOLE SOD DR 40 MG T: 40 | 30 days supply | Qty: 30 | Fill #0

## 2015-11-22 MED FILL — ADDERALL XR 25 MG CAPSULE: 25 | 30 days supply | Qty: 30 | Fill #0

## 2015-11-22 MED FILL — MONTELUKAST SOD 10 MG TAB: 10 | 90 days supply | Qty: 90 | Fill #1

## 2015-11-25 DIAGNOSIS — J3089 Other allergic rhinitis: Secondary | ICD-10-CM | POA: Diagnosis not present

## 2015-11-25 DIAGNOSIS — J301 Allergic rhinitis due to pollen: Secondary | ICD-10-CM | POA: Diagnosis not present

## 2015-12-01 DIAGNOSIS — J301 Allergic rhinitis due to pollen: Secondary | ICD-10-CM | POA: Diagnosis not present

## 2015-12-01 DIAGNOSIS — J3089 Other allergic rhinitis: Secondary | ICD-10-CM | POA: Diagnosis not present

## 2015-12-08 DIAGNOSIS — J3089 Other allergic rhinitis: Secondary | ICD-10-CM | POA: Diagnosis not present

## 2015-12-08 DIAGNOSIS — J301 Allergic rhinitis due to pollen: Secondary | ICD-10-CM | POA: Diagnosis not present

## 2015-12-13 MED FILL — LEVOCETIRIZINE 5 MG TABLET: 5 | 90 days supply | Qty: 90 | Fill #0

## 2015-12-21 ENCOUNTER — Other Ambulatory Visit: Payer: Self-pay | Admitting: Physician Assistant

## 2015-12-21 DIAGNOSIS — F988 Other specified behavioral and emotional disorders with onset usually occurring in childhood and adolescence: Secondary | ICD-10-CM

## 2015-12-23 MED ORDER — AMPHETAMINE-DEXTROAMPHET ER 25 MG PO CP24: 25.0000 mg | ORAL_CAPSULE | ORAL | 0 refills | Status: DC

## 2015-12-23 MED ORDER — PANTOPRAZOLE SODIUM 40 MG PO TBEC: 40.0000 mg | DELAYED_RELEASE_TABLET | Freq: Every day | ORAL | 0 refills | Status: DC

## 2015-12-23 NOTE — Telephone Encounter (Signed)
Meds ordered this encounter  Medications  . amphetamine-dextroamphetamine (ADDERALL XR) 25 MG 24 hr capsule    Sig: Take 1 capsule by mouth every morning.    Dispense:  30 capsule    Refill:  0    BRAND NAME ADDERALL. May fill 30 days after date on prescription.  Marland Kitchen amphetamine-dextroamphetamine (ADDERALL XR) 25 MG 24 hr capsule    Sig: Take 1 capsule by mouth every morning.    Dispense:  30 capsule    Refill:  0    BRAND NAME ADDERALL  . amphetamine-dextroamphetamine (ADDERALL XR) 25 MG 24 hr capsule    Sig: Take 1 capsule by mouth every morning.    Dispense:  30 capsule    Refill:  0    BRAND NAME ADDERALL. May fill 60 days after date on prescription

## 2015-12-23 NOTE — Telephone Encounter (Signed)
Chelle, pt last seen 7/25 but don't see GERD discussed recently. Do you want to RF until she is due back for f/up, or have her come back now for this?

## 2015-12-23 NOTE — Telephone Encounter (Signed)
Meds ordered this encounter  Medications  . pantoprazole (PROTONIX) 40 MG tablet    Sig: Take 1 tablet (40 mg total) by mouth daily.    Dispense:  90 tablet    Refill:  0

## 2015-12-23 NOTE — Telephone Encounter (Signed)
Pt last seen for ADD 7/25 and was given 3 Rxs then. Pended 3 more.

## 2015-12-24 NOTE — Telephone Encounter (Signed)
Up front for pick up 

## 2015-12-26 MED FILL — PANTOPRAZOLE SOD DR 40 MG T: 40 | 90 days supply | Qty: 90 | Fill #0 | Status: TO

## 2015-12-27 DIAGNOSIS — J301 Allergic rhinitis due to pollen: Secondary | ICD-10-CM | POA: Diagnosis not present

## 2015-12-27 DIAGNOSIS — J3089 Other allergic rhinitis: Secondary | ICD-10-CM | POA: Diagnosis not present

## 2015-12-30 MED FILL — ADDERALL XR 25 MG CAPSULE: 25 | 30 days supply | Qty: 30 | Fill #0

## 2016-01-03 DIAGNOSIS — J301 Allergic rhinitis due to pollen: Secondary | ICD-10-CM | POA: Diagnosis not present

## 2016-01-03 DIAGNOSIS — J3089 Other allergic rhinitis: Secondary | ICD-10-CM | POA: Diagnosis not present

## 2016-01-17 DIAGNOSIS — J3089 Other allergic rhinitis: Secondary | ICD-10-CM | POA: Diagnosis not present

## 2016-01-17 DIAGNOSIS — J301 Allergic rhinitis due to pollen: Secondary | ICD-10-CM | POA: Diagnosis not present

## 2016-02-02 DIAGNOSIS — J3089 Other allergic rhinitis: Secondary | ICD-10-CM | POA: Diagnosis not present

## 2016-02-02 DIAGNOSIS — J301 Allergic rhinitis due to pollen: Secondary | ICD-10-CM | POA: Diagnosis not present

## 2016-02-09 MED FILL — ADDERALL XR 25 MG CAPSULE: 25 | 30 days supply | Qty: 30 | Fill #0

## 2016-02-14 DIAGNOSIS — J301 Allergic rhinitis due to pollen: Secondary | ICD-10-CM | POA: Diagnosis not present

## 2016-02-14 DIAGNOSIS — J3089 Other allergic rhinitis: Secondary | ICD-10-CM | POA: Diagnosis not present

## 2016-02-20 DIAGNOSIS — R05 Cough: Secondary | ICD-10-CM | POA: Diagnosis not present

## 2016-02-20 DIAGNOSIS — J301 Allergic rhinitis due to pollen: Secondary | ICD-10-CM | POA: Diagnosis not present

## 2016-02-20 DIAGNOSIS — J3089 Other allergic rhinitis: Secondary | ICD-10-CM | POA: Diagnosis not present

## 2016-02-20 DIAGNOSIS — K219 Gastro-esophageal reflux disease without esophagitis: Secondary | ICD-10-CM | POA: Diagnosis not present

## 2016-02-23 MED FILL — EPINEPHRINE 0.3 MG AUTO-INJ: 0.3 | 30 days supply | Qty: 2 | Fill #0

## 2016-02-27 ENCOUNTER — Encounter: Payer: Self-pay | Admitting: Student

## 2016-03-14 MED FILL — LEVOCETIRIZINE 5 MG TABLET: 5 | 90 days supply | Qty: 90 | Fill #0 | Status: TO

## 2016-03-14 MED FILL — MONTELUKAST SOD 10 MG TAB: 10 | 90 days supply | Qty: 90 | Fill #0

## 2016-03-14 MED FILL — ADDERALL XR 25 MG CAPSULE: 25 | 30 days supply | Qty: 30 | Fill #0

## 2016-04-11 DIAGNOSIS — J301 Allergic rhinitis due to pollen: Secondary | ICD-10-CM | POA: Diagnosis not present

## 2016-04-11 DIAGNOSIS — J3089 Other allergic rhinitis: Secondary | ICD-10-CM | POA: Diagnosis not present

## 2016-04-12 DIAGNOSIS — F4321 Adjustment disorder with depressed mood: Secondary | ICD-10-CM | POA: Diagnosis not present

## 2016-04-17 DIAGNOSIS — J3089 Other allergic rhinitis: Secondary | ICD-10-CM | POA: Diagnosis not present

## 2016-04-17 DIAGNOSIS — J301 Allergic rhinitis due to pollen: Secondary | ICD-10-CM | POA: Diagnosis not present

## 2016-04-18 ENCOUNTER — Other Ambulatory Visit: Payer: Self-pay | Admitting: Physician Assistant

## 2016-04-18 DIAGNOSIS — F988 Other specified behavioral and emotional disorders with onset usually occurring in childhood and adolescence: Secondary | ICD-10-CM

## 2016-04-18 MED ORDER — AMPHETAMINE-DEXTROAMPHET ER 25 MG PO CP24: 25.0000 mg | ORAL_CAPSULE | ORAL | 0 refills | Status: DC

## 2016-04-18 NOTE — Telephone Encounter (Signed)
Meds ordered this encounter  Medications  . amphetamine-dextroamphetamine (ADDERALL XR) 25 MG 24 hr capsule    Sig: Take 1 capsule by mouth every morning.    Dispense:  30 capsule    Refill:  0    BRAND NAME ADDERALL   Place in my box to sign tomorrow. Patient notified via My Chart, including that she needs OV with me for next fill.

## 2016-04-18 NOTE — Telephone Encounter (Signed)
PT CALLED STATING THAT SHE WILL COME INTO OFFICE TOMORROW TO PICK UP RX AND MADE AN APPOINTMENT TO SEE CHELLE IN ONE MONTH

## 2016-04-20 ENCOUNTER — Other Ambulatory Visit: Payer: Self-pay | Admitting: Physician Assistant

## 2016-04-20 DIAGNOSIS — J301 Allergic rhinitis due to pollen: Secondary | ICD-10-CM | POA: Diagnosis not present

## 2016-04-20 DIAGNOSIS — J3089 Other allergic rhinitis: Secondary | ICD-10-CM | POA: Diagnosis not present

## 2016-04-20 MED FILL — ADDERALL XR 25 MG CAPSULE: 25 | 30 days supply | Qty: 30 | Fill #0

## 2016-04-20 NOTE — Telephone Encounter (Signed)
Last seen 08/18/15

## 2016-04-26 DIAGNOSIS — J3089 Other allergic rhinitis: Secondary | ICD-10-CM | POA: Diagnosis not present

## 2016-04-26 DIAGNOSIS — J301 Allergic rhinitis due to pollen: Secondary | ICD-10-CM | POA: Diagnosis not present

## 2016-05-01 DIAGNOSIS — J301 Allergic rhinitis due to pollen: Secondary | ICD-10-CM | POA: Diagnosis not present

## 2016-05-01 DIAGNOSIS — J3089 Other allergic rhinitis: Secondary | ICD-10-CM | POA: Diagnosis not present

## 2016-05-03 DIAGNOSIS — F4321 Adjustment disorder with depressed mood: Secondary | ICD-10-CM | POA: Diagnosis not present

## 2016-05-08 DIAGNOSIS — F4321 Adjustment disorder with depressed mood: Secondary | ICD-10-CM | POA: Diagnosis not present

## 2016-05-10 DIAGNOSIS — J301 Allergic rhinitis due to pollen: Secondary | ICD-10-CM | POA: Diagnosis not present

## 2016-05-10 DIAGNOSIS — J3089 Other allergic rhinitis: Secondary | ICD-10-CM | POA: Diagnosis not present

## 2016-05-17 DIAGNOSIS — F4321 Adjustment disorder with depressed mood: Secondary | ICD-10-CM | POA: Diagnosis not present

## 2016-05-17 DIAGNOSIS — J301 Allergic rhinitis due to pollen: Secondary | ICD-10-CM | POA: Diagnosis not present

## 2016-05-17 DIAGNOSIS — J3089 Other allergic rhinitis: Secondary | ICD-10-CM | POA: Diagnosis not present

## 2016-05-21 DIAGNOSIS — H5231 Anisometropia: Secondary | ICD-10-CM | POA: Diagnosis not present

## 2016-05-22 ENCOUNTER — Ambulatory Visit (INDEPENDENT_AMBULATORY_CARE_PROVIDER_SITE_OTHER): Payer: 59 | Admitting: Physician Assistant

## 2016-05-22 ENCOUNTER — Encounter: Payer: Self-pay | Admitting: Physician Assistant

## 2016-05-22 VITALS — BP 129/90 | HR 66 | Temp 98.2°F | Resp 16 | Ht 67.0 in | Wt 252.4 lb

## 2016-05-22 DIAGNOSIS — F988 Other specified behavioral and emotional disorders with onset usually occurring in childhood and adolescence: Secondary | ICD-10-CM

## 2016-05-22 DIAGNOSIS — K219 Gastro-esophageal reflux disease without esophagitis: Secondary | ICD-10-CM | POA: Diagnosis not present

## 2016-05-22 DIAGNOSIS — F329 Major depressive disorder, single episode, unspecified: Secondary | ICD-10-CM | POA: Diagnosis not present

## 2016-05-22 DIAGNOSIS — E785 Hyperlipidemia, unspecified: Secondary | ICD-10-CM | POA: Diagnosis not present

## 2016-05-22 DIAGNOSIS — F419 Anxiety disorder, unspecified: Secondary | ICD-10-CM | POA: Diagnosis not present

## 2016-05-22 DIAGNOSIS — F32A Depression, unspecified: Secondary | ICD-10-CM

## 2016-05-22 DIAGNOSIS — E669 Obesity, unspecified: Secondary | ICD-10-CM

## 2016-05-22 MED ORDER — AMPHETAMINE-DEXTROAMPHET ER 25 MG PO CP24: 25.0000 mg | ORAL_CAPSULE | ORAL | 0 refills | Status: DC

## 2016-05-22 MED ORDER — AMPHETAMINE-DEXTROAMPHET ER 25 MG PO CP24
25.0000 mg | ORAL_CAPSULE | ORAL | 0 refills | Status: DC
Start: 2016-05-22 — End: 2016-05-29

## 2016-05-22 NOTE — Assessment & Plan Note (Signed)
Continue working on regular exercise. Refer to [facility].

## 2016-05-22 NOTE — Assessment & Plan Note (Signed)
Rarely symptomatic off PPI. Continue with healthy lifestyle changes and PRN TUMS.

## 2016-05-22 NOTE — Assessment & Plan Note (Signed)
Exercise. [facility] referral. Update fasting lipids with next lab draw. Hopefully this will occur at her visit with GYN in the next couple of weeks.

## 2016-05-22 NOTE — Progress Notes (Signed)
Patient ID: Victoria Norton, female    DOB: December 20, 1965, 51 y.o.   MRN: [id number]  PCP: Harrison Mons, PA-C  Chief Complaint  Patient presents with  . Medication Refill    adderall    Subjective:   Presents for evaluation of ADD.  Adderall continues to work well for her. Not experiences adverse effects.  Has a new therapist, after her previous person retired. Now sees Bonnita Nasuti (on the recommendation of her previous person. Really happy with the new therapist. Discussed medical therapy, but is currently using exercise as her anti-depressant. 2 moody teenagers.  Saw her eye specialist last week. Sees [facility] Alas for GYN soon. Planning to schedule her mammogram.  Stopped PPI when she ran out. Only occasionally gets heartburn, for which she takes [place].  Not fasting today for lipids.   Review of Systems No chest pain, SOB, HA, dizziness, vision change, N/V, diarrhea, constipation, dysuria, urinary urgency or frequency, myalgias, arthralgias or rash.     Patient Active Problem List   Diagnosis Date Noted  . Anemia 08/23/2015  . Vitamin D deficiency 08/23/2015  . GERD (gastroesophageal reflux disease) 08/31/2014  . Hyperlipidemia 07/22/2014  . Obesity (BMI 30-39.9) 04/21/2013  . AR (allergic rhinitis) 01/03/2012  . Anxiety and depression   . ADD (attention deficit disorder)   . Fibroadenoma of breast   . Kidney lesion   . DUB (dysfunctional uterine bleeding) 08/29/2011     Prior to Admission medications   Medication Sig Start Date End Date Taking? Authorizing Provider  amphetamine-dextroamphetamine (ADDERALL XR) 25 MG 24 hr capsule Take 1 capsule by mouth every morning. 12/23/15  Yes Audrick Lamoureaux, PA-C  amphetamine-dextroamphetamine (ADDERALL XR) 25 MG 24 hr capsule Take 1 capsule by mouth every morning. 12/23/15  Yes Nevaen Tredway, PA-C  amphetamine-dextroamphetamine (ADDERALL XR) 25 MG 24 hr capsule Take 1 capsule by mouth every morning. 04/18/16  Yes  Adellyn Capek, PA-C  levocetirizine (XYZAL) 5 MG tablet  02/03/15  Yes Historical Provider, MD  mometasone (NASONEX) 50 MCG/ACT nasal spray Place 2 sprays into the nose daily.   Yes Historical Provider, MD  montelukast (SINGULAIR) 10 MG tablet Take 1 tablet (10 mg total) by mouth at bedtime. 07/22/14  Yes Quinlin Conant, PA-C  ferrous sulfate 325 (65 FE) MG EC tablet Take 325 mg by mouth 3 (three) times daily with meals.    Historical Provider, MD  pantoprazole (PROTONIX) 40 MG tablet TAKE 1 TABLET (40 MG TOTAL) BY MOUTH DAILY. Patient not taking: Reported on 05/22/2016 04/21/16   Harrison Mons, PA-C  Vitamin D, Ergocalciferol, (DRISDOL) 50000 units CAPS capsule Take one tablet by mouth weekly for 12 weeks then schedule lab appointment to have vitamin level rechecked. Patient not taking: Reported on 08/23/2015 05/30/15   Huel Cote, NP     Allergies  Allergen Reactions  . Lactose Diarrhea       Objective:  Physical Exam  Constitutional: She is oriented to person, place, and time. She appears well-developed and well-nourished. She is active and cooperative. No distress.  BP 129/90 (BP Location: Right Arm, Patient Position: Sitting, Cuff Size: Large)   Pulse 66   Temp 98.2 F (36.8 C) (Oral)   Resp 16   Ht 5\' 7"  (1.702 m)   Wt 252 lb 6.4 oz (114.5 kg)   SpO2 97%   BMI 39.53 kg/m   HENT:  Head: Normocephalic and atraumatic.  Right Ear: Hearing normal.  Left Ear: Hearing normal.  Eyes: Conjunctivae are normal.  No scleral icterus.  Neck: Normal range of motion. Neck supple. No thyromegaly present.  Cardiovascular: Normal rate, regular rhythm and normal heart sounds.   Pulses:      Radial pulses are 2+ on the right side, and 2+ on the left side.  Pulmonary/Chest: Effort normal and breath sounds normal.  Lymphadenopathy:       Head (right side): No tonsillar, no preauricular, no posterior auricular and no occipital adenopathy present.       Head (left side): No tonsillar, no  preauricular, no posterior auricular and no occipital adenopathy present.    She has no cervical adenopathy.       Right: No supraclavicular adenopathy present.       Left: No supraclavicular adenopathy present.  Neurological: She is alert and oriented to person, place, and time. No sensory deficit.  Skin: Skin is warm, dry and intact. No rash noted. No cyanosis or erythema. Nails show no clubbing.  Psychiatric: She has a normal mood and affect. Her speech is normal and behavior is normal.       Wt Readings from Last 3 Encounters:  05/22/16 252 lb 6.4 oz (114.5 kg)  08/23/15 248 lb (112.5 kg)  05/27/15 248 lb (112.5 kg)       Assessment & Plan:   Problem List Items Addressed This Visit    Anxiety and depression    Continue with CBT and exercise.       ADD (attention deficit disorder) - Primary    Well controlled. Continue Adderall XR (BRAND NAME) 25 mg. May call in 3 months for prescriptions. Re-evaluate in 6 months.      Relevant Medications   amphetamine-dextroamphetamine (ADDERALL XR) 25 MG 24 hr capsule   amphetamine-dextroamphetamine (ADDERALL XR) 25 MG 24 hr capsule   amphetamine-dextroamphetamine (ADDERALL XR) 25 MG 24 hr capsule   Obesity (BMI 30-39.9)    Continue working on regular exercise. Refer to [facility].      Relevant Medications   amphetamine-dextroamphetamine (ADDERALL XR) 25 MG 24 hr capsule   amphetamine-dextroamphetamine (ADDERALL XR) 25 MG 24 hr capsule   amphetamine-dextroamphetamine (ADDERALL XR) 25 MG 24 hr capsule   Other Relevant Orders   Amb Ref to Medical Weight Management   Hyperlipidemia    Exercise. [facility] referral. Update fasting lipids with next lab draw. Hopefully this will occur at her visit with GYN in the next couple of weeks.      GERD (gastroesophageal reflux disease)    Rarely symptomatic off PPI. Continue with healthy lifestyle changes and PRN TUMS.          Return in about 6  months (around 11/21/2016).   Fara Chute, PA-C [facility]

## 2016-05-22 NOTE — Assessment & Plan Note (Signed)
Well controlled. Continue Adderall XR (BRAND NAME) 25 mg. May call in 3 months for prescriptions. Re-evaluate in 6 months.

## 2016-05-22 NOTE — Patient Instructions (Addendum)
We recommend that you schedule a mammogram for breast cancer screening. Typically, you do not need a referral to do this. Please contact a local imaging center to schedule your mammogram.  [facility] - [phone]  *ask for the Radiology Department [facility]) - [phone] or [phone]  [facility] - [phone] [facility] - [phone] [facility] - [phone]  *ask for the Radiology Department [facility] - [phone]  *ask for the Radiology Department [facility] - [phone]  *ask for the Mammography Department [facility] - [phone]     IF you received an x-ray today, you will receive an invoice from [facility]. Please contact [facility] at [phone] with questions or concerns regarding your invoice.   IF you received labwork today, you will receive an invoice from [facility]. Please contact [facility] at [phone] with questions or concerns regarding your invoice.   Our billing staff will not be able to assist you with questions regarding bills from these companies.  You will be contacted with the lab results as soon as they are available. The fastest way to get your results is to activate your My Chart account. Instructions are located on the last page of this paperwork. If you have not heard from us regarding the results in 2 weeks, please contact this office.      

## 2016-05-22 NOTE — Assessment & Plan Note (Signed)
Continue with CBT and exercise.

## 2016-05-24 DIAGNOSIS — J301 Allergic rhinitis due to pollen: Secondary | ICD-10-CM | POA: Diagnosis not present

## 2016-05-24 DIAGNOSIS — J3089 Other allergic rhinitis: Secondary | ICD-10-CM | POA: Diagnosis not present

## 2016-05-24 MED FILL — ADDERALL XR 25 MG CAPSULE: 25 | 30 days supply | Qty: 30 | Fill #0

## 2016-05-29 ENCOUNTER — Encounter: Payer: Self-pay | Admitting: Women's Health

## 2016-05-29 ENCOUNTER — Ambulatory Visit (INDEPENDENT_AMBULATORY_CARE_PROVIDER_SITE_OTHER): Payer: 59 | Admitting: Women's Health

## 2016-05-29 VITALS — BP 132/80 | Ht 67.0 in | Wt 252.0 lb

## 2016-05-29 DIAGNOSIS — Z13220 Encounter for screening for lipoid disorders: Secondary | ICD-10-CM | POA: Diagnosis not present

## 2016-05-29 DIAGNOSIS — R635 Abnormal weight gain: Secondary | ICD-10-CM | POA: Diagnosis not present

## 2016-05-29 DIAGNOSIS — E559 Vitamin D deficiency, unspecified: Secondary | ICD-10-CM | POA: Diagnosis not present

## 2016-05-29 DIAGNOSIS — Z01419 Encounter for gynecological examination (general) (routine) without abnormal findings: Secondary | ICD-10-CM | POA: Diagnosis not present

## 2016-05-29 LAB — LIPID PANEL
Cholesterol: 232 mg/dL — ABNORMAL HIGH (ref <200)
HDL: 49 mg/dL — ABNORMAL LOW (ref >50)
LDL Cholesterol: 139 mg/dL — ABNORMAL HIGH (ref <100)
Total CHOL/HDL Ratio: 4.7 Ratio (ref <5.0)
Triglycerides: 218 mg/dL — ABNORMAL HIGH (ref <150)
VLDL: 44 mg/dL — ABNORMAL HIGH (ref <30)

## 2016-05-29 LAB — CMP 10231
AG Ratio: 1.8 Ratio (ref 1.0–2.5)
ALK PHOS: 82 U/L (ref 33–130)
ALT: 15 U/L (ref 6–29)
AST: 14 U/L (ref 10–35)
Albumin: 4.2 g/dL (ref 3.6–5.1)
BUN/Creatinine Ratio: 15.6 Ratio (ref 6–22)
BUN: 10 mg/dL (ref 7–25)
CO2: 25 mmol/L (ref 20–31)
Calcium: 9.2 mg/dL (ref 8.6–10.4)
Chloride: 105 mmol/L (ref 98–110)
Creat: 0.64 mg/dL (ref 0.50–1.05)
Globulin: 2.4 g/dL (ref 1.9–3.7)
Glucose, Bld: 86 mg/dL (ref 65–99)
Potassium: 4.7 mmol/L (ref 3.5–5.3)
SODIUM: 140 mmol/L (ref 135–146)
Total Bilirubin: 0.4 mg/dL (ref 0.2–1.2)
Total Protein: 6.6 g/dL (ref 6.1–8.1)

## 2016-05-29 LAB — CBC WITH DIFFERENTIAL/PLATELET
Basophils Absolute: 64 cells/uL (ref 0–200)
Basophils Relative: 1 %
EOS ABS: 192 {cells}/uL (ref 15–500)
Eosinophils Relative: 3 %
HEMATOCRIT: 42.1 % (ref 35.0–45.0)
Hemoglobin: 14.3 g/dL (ref 11.7–15.5)
LYMPHS PCT: 36 %
Lymphs Abs: 2304 cells/uL (ref 850–3900)
MCH: 29.4 pg (ref 27.0–33.0)
MCHC: 34 g/dL (ref 32.0–36.0)
MCV: 86.6 fL (ref 80.0–100.0)
MONO ABS: 320 {cells}/uL (ref 200–950)
MPV: 9.2 fL (ref 7.5–12.5)
Monocytes Relative: 5 %
NEUTROS PCT: 55 %
Neutro Abs: 3520 cells/uL (ref 1500–7800)
Platelets: 330 10*3/uL (ref 140–400)
RBC: 4.86 MIL/uL (ref 3.80–5.10)
RDW: 14.2 % (ref 11.0–15.0)
WBC: 6.4 10*3/uL (ref 3.8–10.8)

## 2016-05-29 LAB — TSH: TSH: 2.93 m[IU]/L

## 2016-05-29 NOTE — Progress Notes (Signed)
Victoria Norton [birth year] [id number]    History:    Presents for annual exam. History of heavy cycles,this past year no cycles for 6 months. In the last 2 months had light cycles. Condoms for contraception. Normal Pap and mammogram history. 1997 breast fibroadenoma. 11/2014 colon polyp adenoma repeat in 5 years. History of ADD, anxiety and depression seeing a therapist. Obesity seeing a weight loss management physician.  Past medical history, past surgical history, family history and social history were all reviewed and documented in the EPIC chart. Has a consulting business. Husband works for [employer] in Engineer, technical sales. Has 2 sons ages 23 and 8 both doing well, both had Gardasil.  ROS:  A ROS was performed and pertinent positives and negatives are included.  Exam:  Vitals:   05/29/16 0904  BP: 132/80  Weight: 252 lb (114.3 kg)  Height: 5\' 7"  (1.702 m)   Body mass index is 39.47 kg/m.   General appearance:  Normal Thyroid:  Symmetrical, normal in size, without palpable masses or nodularity. Respiratory  Auscultation:  Clear without wheezing or rhonchi Cardiovascular  Auscultation:  Regular rate, without rubs, murmurs or gallops  Edema/varicosities:  Not grossly evident Abdominal  Soft,nontender, without masses, guarding or rebound.  Liver/spleen:  No organomegaly noted  Hernia:  None appreciated  Skin  Inspection:  Grossly normal   Breasts: Examined lying and sitting.     Right: Without masses, retractions, discharge or axillary adenopathy.     Left: Without masses, retractions, discharge or axillary adenopathy. Gentitourinary   Inguinal/mons:  Normal without inguinal adenopathy  External genitalia:  Normal  BUS/Urethra/Skene's glands:  Normal  Vagina:  Normal  Cervix:  Normal  Uterus:  normal in size, shape and contour.  Midline and mobile  Adnexa/parametria:     Rt: Without masses or tenderness.   Lt: Without masses or tenderness.  Anus and perineum: Normal  Digital rectal  exam: Normal sphincter tone without palpated masses or tenderness  Assessment/Plan:  51 y.o.   MWF G2 P2 for annual exam.     Perimenopausal-Past year-Cycles becoming irregular, much lighter  Obesity ADD-primary care manages Anxiety/depression-therapist  Plan: SBE's, continue annual 3-D screening mammogram, history of dense breasts, calcium rich diet, vitamin D 2000 daily encouraged. Reviewed importance of decreasing calories and increasing exercise for weight loss,  self-care/leisure activities encouraged. CBC,  CMP, lipid panel, vitamin D, TSH, Pap normal with negative HR HPV typing 2016, new screening guidelines reviewed. Menopause reviewed.     [place], 9:42 AM 05/29/2016

## 2016-05-29 NOTE — Patient Instructions (Signed)
Health Maintenance, Female Adopting a healthy lifestyle and getting preventive care can go a long way to promote health and wellness. Talk with your health care provider about what schedule of regular examinations is right for you. This is a good chance for you to check in with your provider about disease prevention and staying healthy. In between checkups, there are plenty of things you can do on your own. Experts have done a lot of research about which lifestyle changes and preventive measures are most likely to keep you healthy. Ask your health care provider for more information. Weight and diet Eat a healthy diet  Be sure to include plenty of vegetables, fruits, low-fat dairy products, and lean protein.  Do not eat a lot of foods high in solid fats, added sugars, or salt.  Get regular exercise. This is one of the most important things you can do for your health.  Most adults should exercise for at least 150 minutes each week. The exercise should increase your heart rate and make you sweat (moderate-intensity exercise).  Most adults should also do strengthening exercises at least twice a week. This is in addition to the moderate-intensity exercise. Maintain a healthy weight  Body mass index (BMI) is a measurement that can be used to identify possible weight problems. It estimates body fat based on height and weight. Your health care provider can help determine your BMI and help you achieve or maintain a healthy weight.  For females 76 years of age and older:  A BMI below 18.5 is considered underweight.  A BMI of 18.5 to 24.9 is normal.  A BMI of 25 to 29.9 is considered overweight.  A BMI of 30 and above is considered obese. Watch levels of cholesterol and blood lipids  You should start having your blood tested for lipids and cholesterol at 52 years of age, then have this test every 5 years.  You may need to have your cholesterol levels checked more often if:  Your lipid or  cholesterol levels are high.  You are older than 52 years of age.  You are at high risk for heart disease. Cancer screening Lung Cancer  Lung cancer screening is recommended for adults 64-42 years old who are at high risk for lung cancer because of a history of smoking.  A yearly low-dose CT scan of the lungs is recommended for people who:  Currently smoke.  Have quit within the past 15 years.  Have at least a 30-pack-year history of smoking. A pack year is smoking an average of one pack of cigarettes a day for 1 year.  Yearly screening should continue until it has been 15 years since you quit.  Yearly screening should stop if you develop a health problem that would prevent you from having lung cancer treatment. Breast Cancer  Practice breast self-awareness. This means understanding how your breasts normally appear and feel.  It also means doing regular breast self-exams. Let your health care provider know about any changes, no matter how small.  If you are in your 20s or 30s, you should have a clinical breast exam (CBE) by a health care provider every 1-3 years as part of a regular health exam.  If you are 34 or older, have a CBE every year. Also consider having a breast X-ray (mammogram) every year.  If you have a family history of breast cancer, talk to your health care provider about genetic screening.  If you are at high risk for breast cancer, talk  to your health care provider about having an MRI and a mammogram every year.  Breast cancer gene (BRCA) assessment is recommended for women who have family members with BRCA-related cancers. BRCA-related cancers include:  Breast.  Ovarian.  Tubal.  Peritoneal cancers.  Results of the assessment will determine the need for genetic counseling and BRCA1 and BRCA2 testing. Cervical Cancer  Your health care provider may recommend that you be screened regularly for cancer of the pelvic organs (ovaries, uterus, and vagina).  This screening involves a pelvic examination, including checking for microscopic changes to the surface of your cervix (Pap test). You may be encouraged to have this screening done every 3 years, beginning at age 24.  For women ages 66-65, health care providers may recommend pelvic exams and Pap testing every 3 years, or they may recommend the Pap and pelvic exam, combined with testing for human papilloma virus (HPV), every 5 years. Some types of HPV increase your risk of cervical cancer. Testing for HPV may also be done on women of any age with unclear Pap test results.  Other health care providers may not recommend any screening for nonpregnant women who are considered low risk for pelvic cancer and who do not have symptoms. Ask your health care provider if a screening pelvic exam is right for you.  If you have had past treatment for cervical cancer or a condition that could lead to cancer, you need Pap tests and screening for cancer for at least 20 years after your treatment. If Pap tests have been discontinued, your risk factors (such as having a new sexual partner) need to be reassessed to determine if screening should resume. Some women have medical problems that increase the chance of getting cervical cancer. In these cases, your health care provider may recommend more frequent screening and Pap tests. Colorectal Cancer  This type of cancer can be detected and often prevented.  Routine colorectal cancer screening usually begins at 52 years of age and continues through 52 years of age.  Your health care provider may recommend screening at an earlier age if you have risk factors for colon cancer.  Your health care provider may also recommend using home test kits to check for hidden blood in the stool.  A small camera at the end of a tube can be used to examine your colon directly (sigmoidoscopy or colonoscopy). This is done to check for the earliest forms of colorectal cancer.  Routine  screening usually begins at age 41.  Direct examination of the colon should be repeated every 5-10 years through 52 years of age. However, you may need to be screened more often if early forms of precancerous polyps or small growths are found. Skin Cancer  Check your skin from head to toe regularly.  Tell your health care provider about any new moles or changes in moles, especially if there is a change in a mole's shape or color.  Also tell your health care provider if you have a mole that is larger than the size of a pencil eraser.  Always use sunscreen. Apply sunscreen liberally and repeatedly throughout the day.  Protect yourself by wearing long sleeves, pants, a wide-brimmed hat, and sunglasses whenever you are outside. Heart disease, diabetes, and high blood pressure  High blood pressure causes heart disease and increases the risk of stroke. High blood pressure is more likely to develop in:  People who have blood pressure in the high end of the normal range (130-139/85-89 mm Hg).  People who are overweight or obese.  People who are African American.  If you are 59-24 years of age, have your blood pressure checked every 3-5 years. If you are 34 years of age or older, have your blood pressure checked every year. You should have your blood pressure measured twice-once when you are at a hospital or clinic, and once when you are not at a hospital or clinic. Record the average of the two measurements. To check your blood pressure when you are not at a hospital or clinic, you can use:  An automated blood pressure machine at a pharmacy.  A home blood pressure monitor.  If you are between 29 years and 60 years old, ask your health care provider if you should take aspirin to prevent strokes.  Have regular diabetes screenings. This involves taking a blood sample to check your fasting blood sugar level.  If you are at a normal weight and have a low risk for diabetes, have this test once  every three years after 52 years of age.  If you are overweight and have a high risk for diabetes, consider being tested at a younger age or more often. Preventing infection Hepatitis B  If you have a higher risk for hepatitis B, you should be screened for this virus. You are considered at high risk for hepatitis B if:  You were born in a country where hepatitis B is common. Ask your health care provider which countries are considered high risk.  Your parents were born in a high-risk country, and you have not been immunized against hepatitis B (hepatitis B vaccine).  You have HIV or AIDS.  You use needles to inject street drugs.  You live with someone who has hepatitis B.  You have had sex with someone who has hepatitis B.  You get hemodialysis treatment.  You take certain medicines for conditions, including cancer, organ transplantation, and autoimmune conditions. Hepatitis C  Blood testing is recommended for:  Everyone born from [birth year] through 1966.  Anyone with known risk factors for hepatitis C. Sexually transmitted infections (STIs)  You should be screened for sexually transmitted infections (STIs) including gonorrhea and chlamydia if:  You are sexually active and are younger than 52 years of age.  You are older than 52 years of age and your health care provider tells you that you are at risk for this type of infection.  Your sexual activity has changed since you were last screened and you are at an increased risk for chlamydia or gonorrhea. Ask your health care provider if you are at risk.  If you do not have HIV, but are at risk, it may be recommended that you take a prescription medicine daily to prevent HIV infection. This is called pre-exposure prophylaxis (PrEP). You are considered at risk if:  You are sexually active and do not regularly use condoms or know the HIV status of your partner(s).  You take drugs by injection.  You are sexually active with a partner  who has HIV. Talk with your health care provider about whether you are at high risk of being infected with HIV. If you choose to begin PrEP, you should first be tested for HIV. You should then be tested every 3 months for as long as you are taking PrEP. Pregnancy  If you are premenopausal and you may become pregnant, ask your health care provider about preconception counseling.  If you may become pregnant, take 400 to 800 micrograms (mcg) of folic acid  every day.  If you want to prevent pregnancy, talk to your health care provider about birth control (contraception). Osteoporosis and menopause  Osteoporosis is a disease in which the bones lose minerals and strength with aging. This can result in serious bone fractures. Your risk for osteoporosis can be identified using a bone density scan.  If you are 4 years of age or older, or if you are at risk for osteoporosis and fractures, ask your health care provider if you should be screened.  Ask your health care provider whether you should take a calcium or vitamin D supplement to lower your risk for osteoporosis.  Menopause may have certain physical symptoms and risks.  Hormone replacement therapy may reduce some of these symptoms and risks. Talk to your health care provider about whether hormone replacement therapy is right for you. Follow these instructions at home:  Schedule regular health, dental, and eye exams.  Stay current with your immunizations.  Do not use any tobacco products including cigarettes, chewing tobacco, or electronic cigarettes.  If you are pregnant, do not drink alcohol.  If you are breastfeeding, limit how much and how often you drink alcohol.  Limit alcohol intake to no more than 1 drink per day for nonpregnant women. One drink equals 12 ounces of beer, 5 ounces of wine, or 1 ounces of hard liquor.  Do not use street drugs.  Do not share needles.  Ask your health care provider for help if you need support  or information about quitting drugs.  Tell your health care provider if you often feel depressed.  Tell your health care provider if you have ever been abused or do not feel safe at home. This information is not intended to replace advice given to you by your health care provider. Make sure you discuss any questions you have with your health care provider. Document Released: 07/31/2010 Document Revised: 06/23/2015 Document Reviewed: 10/19/2014 Elsevier Interactive Patient Education  2017 [employer].

## 2016-05-30 DIAGNOSIS — F4321 Adjustment disorder with depressed mood: Secondary | ICD-10-CM | POA: Diagnosis not present

## 2016-05-30 LAB — VITAMIN D 25 HYDROXY (VIT D DEFICIENCY, FRACTURES): VIT D 25 HYDROXY: 20 ng/mL — AB (ref 30–100)

## 2016-05-31 ENCOUNTER — Other Ambulatory Visit: Payer: Self-pay | Admitting: Women's Health

## 2016-05-31 DIAGNOSIS — E7800 Pure hypercholesterolemia, unspecified: Secondary | ICD-10-CM

## 2016-05-31 DIAGNOSIS — E559 Vitamin D deficiency, unspecified: Secondary | ICD-10-CM

## 2016-05-31 MED ORDER — VITAMIN D (ERGOCALCIFEROL) 1.25 MG (50000 UNIT) PO CAPS: 50000.0000 [IU] | ORAL_CAPSULE | ORAL | 0 refills | Status: DC

## 2016-05-31 MED FILL — VIT D2 1.25 MG (50,000 UNIT: 1.25 MG | 84 days supply | Qty: 12 | Fill #0

## 2016-06-04 DIAGNOSIS — J301 Allergic rhinitis due to pollen: Secondary | ICD-10-CM | POA: Diagnosis not present

## 2016-06-04 DIAGNOSIS — J3089 Other allergic rhinitis: Secondary | ICD-10-CM | POA: Diagnosis not present

## 2016-06-11 DIAGNOSIS — F4321 Adjustment disorder with depressed mood: Secondary | ICD-10-CM | POA: Diagnosis not present

## 2016-06-12 DIAGNOSIS — J3089 Other allergic rhinitis: Secondary | ICD-10-CM | POA: Diagnosis not present

## 2016-06-12 DIAGNOSIS — J301 Allergic rhinitis due to pollen: Secondary | ICD-10-CM | POA: Diagnosis not present

## 2016-06-14 ENCOUNTER — Telehealth: Payer: Self-pay

## 2016-06-14 NOTE — Telephone Encounter (Signed)
My Chart email I sent to patient 05/31/16 was returned unread today. I called her and per DPR access note I read her the note below in voice mail. I asked her to call me with any questions and left my direct phone number,  Victoria Norton, Victoria Norton reviewed your results and recommended "Review vitamin D level low, 50,000 IUs weekly for 12 weeks and then start over-the-counter Vitamin D3 2000 IUs daily. Blood sugar, electrolytes and CBC all normal. Lipid panel elevated. Is currently working with a weight loss program. Complete program, 30 minutes brisk walking / exercise most days of the week, and recheck lipid panel in 4 months along with vitamin D. Fish oil supplement twice daily, less than 20 g saturated fat."   I sent your prescription for the Vitamin D 50,000 units to your [facility]. I have placed lab orders and will send you a recall letter in 4 mos to remind you to return for Vitamin D level and Fasting lipid profile recheck.

## 2016-06-15 ENCOUNTER — Encounter: Payer: Self-pay | Admitting: Women's Health

## 2016-06-15 DIAGNOSIS — Z1231 Encounter for screening mammogram for malignant neoplasm of breast: Secondary | ICD-10-CM | POA: Diagnosis not present

## 2016-06-18 DIAGNOSIS — F4321 Adjustment disorder with depressed mood: Secondary | ICD-10-CM | POA: Diagnosis not present

## 2016-06-21 DIAGNOSIS — J3089 Other allergic rhinitis: Secondary | ICD-10-CM | POA: Diagnosis not present

## 2016-06-21 DIAGNOSIS — J301 Allergic rhinitis due to pollen: Secondary | ICD-10-CM | POA: Diagnosis not present

## 2016-06-27 DIAGNOSIS — F4321 Adjustment disorder with depressed mood: Secondary | ICD-10-CM | POA: Diagnosis not present

## 2016-06-27 MED FILL — ADDERALL XR 25 MG CAPSULE: 25 | 30 days supply | Qty: 30 | Fill #0

## 2016-06-27 MED FILL — LEVOCETIRIZINE 5 MG TABLET: 5 | 90 days supply | Qty: 90 | Fill #0

## 2016-06-28 DIAGNOSIS — J301 Allergic rhinitis due to pollen: Secondary | ICD-10-CM | POA: Diagnosis not present

## 2016-06-28 DIAGNOSIS — J3089 Other allergic rhinitis: Secondary | ICD-10-CM | POA: Diagnosis not present

## 2016-07-05 DIAGNOSIS — F4321 Adjustment disorder with depressed mood: Secondary | ICD-10-CM | POA: Diagnosis not present

## 2016-07-05 DIAGNOSIS — J301 Allergic rhinitis due to pollen: Secondary | ICD-10-CM | POA: Diagnosis not present

## 2016-07-06 DIAGNOSIS — J3089 Other allergic rhinitis: Secondary | ICD-10-CM | POA: Diagnosis not present

## 2016-07-20 DIAGNOSIS — J301 Allergic rhinitis due to pollen: Secondary | ICD-10-CM | POA: Diagnosis not present

## 2016-07-20 DIAGNOSIS — F4321 Adjustment disorder with depressed mood: Secondary | ICD-10-CM | POA: Diagnosis not present

## 2016-07-20 DIAGNOSIS — J3089 Other allergic rhinitis: Secondary | ICD-10-CM | POA: Diagnosis not present

## 2016-07-25 DIAGNOSIS — F4321 Adjustment disorder with depressed mood: Secondary | ICD-10-CM | POA: Diagnosis not present

## 2016-07-27 DIAGNOSIS — J3089 Other allergic rhinitis: Secondary | ICD-10-CM | POA: Diagnosis not present

## 2016-07-27 DIAGNOSIS — J301 Allergic rhinitis due to pollen: Secondary | ICD-10-CM | POA: Diagnosis not present

## 2016-07-27 MED FILL — ADDERALL XR 25 MG CAPSULE: 25 | 30 days supply | Qty: 30 | Fill #0

## 2016-08-15 DIAGNOSIS — F4321 Adjustment disorder with depressed mood: Secondary | ICD-10-CM | POA: Diagnosis not present

## 2016-08-22 DIAGNOSIS — F4321 Adjustment disorder with depressed mood: Secondary | ICD-10-CM | POA: Diagnosis not present

## 2016-08-28 DIAGNOSIS — F4321 Adjustment disorder with depressed mood: Secondary | ICD-10-CM | POA: Diagnosis not present

## 2016-08-30 ENCOUNTER — Other Ambulatory Visit: Payer: Self-pay | Admitting: Physician Assistant

## 2016-08-30 DIAGNOSIS — F988 Other specified behavioral and emotional disorders with onset usually occurring in childhood and adolescence: Secondary | ICD-10-CM

## 2016-08-30 NOTE — Telephone Encounter (Signed)
Prescriptions pended. Please sign on my behalf.

## 2016-08-31 MED ORDER — AMPHETAMINE-DEXTROAMPHET ER 25 MG PO CP24: 25.0000 mg | ORAL_CAPSULE | ORAL | 0 refills | Status: DC

## 2016-08-31 NOTE — Telephone Encounter (Signed)
Done

## 2016-09-03 DIAGNOSIS — F4321 Adjustment disorder with depressed mood: Secondary | ICD-10-CM | POA: Diagnosis not present

## 2016-09-07 MED FILL — ADDERALL XR 25 MG CAPSULE: 25 | 30 days supply | Qty: 30 | Fill #0

## 2016-09-10 DIAGNOSIS — J3089 Other allergic rhinitis: Secondary | ICD-10-CM | POA: Diagnosis not present

## 2016-09-10 DIAGNOSIS — J301 Allergic rhinitis due to pollen: Secondary | ICD-10-CM | POA: Diagnosis not present

## 2016-09-12 DIAGNOSIS — F4321 Adjustment disorder with depressed mood: Secondary | ICD-10-CM | POA: Diagnosis not present

## 2016-09-17 DIAGNOSIS — F4321 Adjustment disorder with depressed mood: Secondary | ICD-10-CM | POA: Diagnosis not present

## 2016-09-24 DIAGNOSIS — F4321 Adjustment disorder with depressed mood: Secondary | ICD-10-CM | POA: Diagnosis not present

## 2016-09-27 DIAGNOSIS — J3089 Other allergic rhinitis: Secondary | ICD-10-CM | POA: Diagnosis not present

## 2016-09-27 DIAGNOSIS — J301 Allergic rhinitis due to pollen: Secondary | ICD-10-CM | POA: Diagnosis not present

## 2016-10-02 DIAGNOSIS — J301 Allergic rhinitis due to pollen: Secondary | ICD-10-CM | POA: Diagnosis not present

## 2016-10-02 DIAGNOSIS — J3089 Other allergic rhinitis: Secondary | ICD-10-CM | POA: Diagnosis not present

## 2016-10-03 DIAGNOSIS — F4321 Adjustment disorder with depressed mood: Secondary | ICD-10-CM | POA: Diagnosis not present

## 2016-10-09 DIAGNOSIS — J3089 Other allergic rhinitis: Secondary | ICD-10-CM | POA: Diagnosis not present

## 2016-10-09 DIAGNOSIS — J301 Allergic rhinitis due to pollen: Secondary | ICD-10-CM | POA: Diagnosis not present

## 2016-10-09 MED FILL — ADDERALL XR 25 MG CAPSULE: 25 | 30 days supply | Qty: 30 | Fill #0

## 2016-10-10 DIAGNOSIS — F4321 Adjustment disorder with depressed mood: Secondary | ICD-10-CM | POA: Diagnosis not present

## 2016-10-10 MED FILL — LEVOCETIRIZINE 5 MG TABLET: 5 | 90 days supply | Qty: 90 | Fill #0

## 2016-10-18 DIAGNOSIS — F4321 Adjustment disorder with depressed mood: Secondary | ICD-10-CM | POA: Diagnosis not present

## 2016-10-19 DIAGNOSIS — J301 Allergic rhinitis due to pollen: Secondary | ICD-10-CM | POA: Diagnosis not present

## 2016-10-19 DIAGNOSIS — J3089 Other allergic rhinitis: Secondary | ICD-10-CM | POA: Diagnosis not present

## 2016-10-22 DIAGNOSIS — F4321 Adjustment disorder with depressed mood: Secondary | ICD-10-CM | POA: Diagnosis not present

## 2016-10-25 DIAGNOSIS — J301 Allergic rhinitis due to pollen: Secondary | ICD-10-CM | POA: Diagnosis not present

## 2016-10-25 DIAGNOSIS — J3089 Other allergic rhinitis: Secondary | ICD-10-CM | POA: Diagnosis not present

## 2016-10-31 DIAGNOSIS — F4321 Adjustment disorder with depressed mood: Secondary | ICD-10-CM | POA: Diagnosis not present

## 2016-11-01 DIAGNOSIS — J301 Allergic rhinitis due to pollen: Secondary | ICD-10-CM | POA: Diagnosis not present

## 2016-11-01 DIAGNOSIS — J3089 Other allergic rhinitis: Secondary | ICD-10-CM | POA: Diagnosis not present

## 2016-11-05 DIAGNOSIS — F4321 Adjustment disorder with depressed mood: Secondary | ICD-10-CM | POA: Diagnosis not present

## 2016-11-13 MED FILL — MOMETASONE FUROATE 50 MCG S: 50 | 30 days supply | Qty: 17 | Fill #0

## 2016-11-14 MED FILL — PANTOPRAZOLE SOD DR 40 MG T: 40 | 90 days supply | Qty: 90 | Fill #0

## 2016-11-15 DIAGNOSIS — J301 Allergic rhinitis due to pollen: Secondary | ICD-10-CM | POA: Diagnosis not present

## 2016-11-15 DIAGNOSIS — J3089 Other allergic rhinitis: Secondary | ICD-10-CM | POA: Diagnosis not present

## 2016-11-15 MED FILL — ADDERALL XR 25 MG CAPSULE: 25 | 30 days supply | Qty: 30 | Fill #0

## 2016-11-19 DIAGNOSIS — F4321 Adjustment disorder with depressed mood: Secondary | ICD-10-CM | POA: Diagnosis not present

## 2016-11-23 ENCOUNTER — Ambulatory Visit (INDEPENDENT_AMBULATORY_CARE_PROVIDER_SITE_OTHER): Payer: 59 | Admitting: Physician Assistant

## 2016-11-23 ENCOUNTER — Encounter: Payer: Self-pay | Admitting: Physician Assistant

## 2016-11-23 VITALS — BP 122/88 | HR 78 | Temp 98.1°F | Resp 18 | Ht 67.0 in | Wt 252.2 lb

## 2016-11-23 DIAGNOSIS — K219 Gastro-esophageal reflux disease without esophagitis: Secondary | ICD-10-CM

## 2016-11-23 DIAGNOSIS — E559 Vitamin D deficiency, unspecified: Secondary | ICD-10-CM

## 2016-11-23 DIAGNOSIS — F419 Anxiety disorder, unspecified: Secondary | ICD-10-CM

## 2016-11-23 DIAGNOSIS — Z23 Encounter for immunization: Secondary | ICD-10-CM | POA: Diagnosis not present

## 2016-11-23 DIAGNOSIS — F988 Other specified behavioral and emotional disorders with onset usually occurring in childhood and adolescence: Secondary | ICD-10-CM

## 2016-11-23 DIAGNOSIS — E7800 Pure hypercholesterolemia, unspecified: Secondary | ICD-10-CM

## 2016-11-23 DIAGNOSIS — F329 Major depressive disorder, single episode, unspecified: Secondary | ICD-10-CM

## 2016-11-23 DIAGNOSIS — E785 Hyperlipidemia, unspecified: Secondary | ICD-10-CM | POA: Diagnosis not present

## 2016-11-23 DIAGNOSIS — J3089 Other allergic rhinitis: Secondary | ICD-10-CM | POA: Diagnosis not present

## 2016-11-23 DIAGNOSIS — J301 Allergic rhinitis due to pollen: Secondary | ICD-10-CM | POA: Diagnosis not present

## 2016-11-23 MED ORDER — AMPHETAMINE-DEXTROAMPHET ER 25 MG PO CP24: 25.0000 mg | ORAL_CAPSULE | ORAL | 0 refills | Status: DC

## 2016-11-23 MED ORDER — PANTOPRAZOLE SODIUM 40 MG PO TBEC: 40.0000 mg | DELAYED_RELEASE_TABLET | Freq: Every day | ORAL | 3 refills | Status: AC
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
## 2016-11-23 NOTE — Patient Instructions (Addendum)
[employer] at [facility]    IF you received an x-ray today, you will receive an invoice from [facility]. Please contact [facility] at [phone] with questions or concerns regarding your invoice.   IF you received labwork today, you will receive an invoice from [facility]. Please contact [facility] at [phone] with questions or concerns regarding your invoice.   Our billing staff will not be able to assist you with questions regarding bills from these companies.  You will be contacted with the lab results as soon as they are available. The fastest way to get your results is to activate your My Chart account. Instructions are located on the last page of this paperwork. If you have not heard from [place] regarding the results in 2 weeks, please contact this office.

## 2016-11-27 ENCOUNTER — Other Ambulatory Visit: Payer: Self-pay | Admitting: Women's Health

## 2016-11-27 LAB — LIPID PANEL
CHOLESTEROL: 224 mg/dL — AB (ref <200)
HDL: 57 mg/dL (ref >50)
LDL Cholesterol (Calc): 141 mg/dL (calc) — ABNORMAL HIGH
Non-HDL Cholesterol (Calc): 167 mg/dL (calc) — ABNORMAL HIGH (ref <130)
Total CHOL/HDL Ratio: 3.9 (calc) (ref <5.0)
Triglycerides: 140 mg/dL (ref <150)

## 2016-11-27 LAB — VITAMIN D 25 HYDROXY (VIT D DEFICIENCY, FRACTURES): VIT D 25 HYDROXY: 25 ng/mL — AB (ref 30–100)

## 2016-11-27 MED ORDER — VITAMIN D (ERGOCALCIFEROL) 1.25 MG (50000 UNIT) PO CAPS: 50000.0000 [IU] | ORAL_CAPSULE | ORAL | 0 refills | Status: DC

## 2016-11-27 MED FILL — VIT D2 1.25 MG (50,000 UNIT: 1.25 MG | 84 days supply | Qty: 12 | Fill #0

## 2016-12-03 DIAGNOSIS — F4321 Adjustment disorder with depressed mood: Secondary | ICD-10-CM | POA: Diagnosis not present

## 2016-12-05 NOTE — Assessment & Plan Note (Signed)
Await labs. Adjust regimen as indicated by results. Rising x 2 years. Healthy lifestyle changes encouraged. May need to initiate statin therapy.

## 2016-12-05 NOTE — Assessment & Plan Note (Signed)
Update vitamin D level 

## 2016-12-05 NOTE — Assessment & Plan Note (Signed)
Well controlled. Continue therapy.

## 2016-12-05 NOTE — Assessment & Plan Note (Signed)
Well controlled. Continue Adderall XR 25 mg daily.

## 2016-12-06 NOTE — Telephone Encounter (Signed)
Copied from [place]. Topic: Quick Communication - See Telephone Encounter >> Dec 06, 2016 10:46 AM Patrice Paradise wrote: CRM for notification. See Telephone encounter for: 12/06/16. Pt called saying that the Protonix 40 mg are not working for her. She stated that she has taking tum with the protonix as well and still having reflux even without eating anything. Pt would like for Dr. Dellis Filbert or her assistant to call @ [phone]

## 2016-12-10 DIAGNOSIS — F4321 Adjustment disorder with depressed mood: Secondary | ICD-10-CM | POA: Diagnosis not present

## 2016-12-17 DIAGNOSIS — F4321 Adjustment disorder with depressed mood: Secondary | ICD-10-CM | POA: Diagnosis not present

## 2016-12-19 DIAGNOSIS — J301 Allergic rhinitis due to pollen: Secondary | ICD-10-CM | POA: Diagnosis not present

## 2016-12-19 DIAGNOSIS — J3089 Other allergic rhinitis: Secondary | ICD-10-CM | POA: Diagnosis not present

## 2016-12-19 MED FILL — ADDERALL XR 25 MG CAPSULE: 25 | 30 days supply | Qty: 30 | Fill #0

## 2016-12-24 DIAGNOSIS — H43811 Vitreous degeneration, right eye: Secondary | ICD-10-CM | POA: Diagnosis not present

## 2016-12-31 DIAGNOSIS — F4321 Adjustment disorder with depressed mood: Secondary | ICD-10-CM | POA: Diagnosis not present

## 2017-01-01 DIAGNOSIS — J301 Allergic rhinitis due to pollen: Secondary | ICD-10-CM | POA: Diagnosis not present

## 2017-01-01 DIAGNOSIS — J3089 Other allergic rhinitis: Secondary | ICD-10-CM | POA: Diagnosis not present

## 2017-01-11 DIAGNOSIS — J3089 Other allergic rhinitis: Secondary | ICD-10-CM | POA: Diagnosis not present

## 2017-01-11 DIAGNOSIS — J301 Allergic rhinitis due to pollen: Secondary | ICD-10-CM | POA: Diagnosis not present

## 2017-01-11 MED FILL — LEVOCETIRIZINE 5 MG TABLET: 5 | 90 days supply | Qty: 90 | Fill #0

## 2017-01-14 DIAGNOSIS — F4321 Adjustment disorder with depressed mood: Secondary | ICD-10-CM | POA: Diagnosis not present

## 2017-01-17 DIAGNOSIS — J301 Allergic rhinitis due to pollen: Secondary | ICD-10-CM | POA: Diagnosis not present

## 2017-01-17 DIAGNOSIS — J3089 Other allergic rhinitis: Secondary | ICD-10-CM | POA: Diagnosis not present

## 2017-01-17 DIAGNOSIS — J3081 Allergic rhinitis due to animal (cat) (dog) hair and dander: Secondary | ICD-10-CM | POA: Diagnosis not present

## 2017-01-17 DIAGNOSIS — H43811 Vitreous degeneration, right eye: Secondary | ICD-10-CM | POA: Diagnosis not present

## 2017-01-25 MED FILL — ADDERALL XR 25 MG CAPSULE: 25 | 30 days supply | Qty: 30 | Fill #0

## 2017-02-04 DIAGNOSIS — F4321 Adjustment disorder with depressed mood: Secondary | ICD-10-CM | POA: Diagnosis not present

## 2017-02-05 ENCOUNTER — Encounter: Payer: Self-pay | Admitting: Family Medicine

## 2017-02-05 ENCOUNTER — Ambulatory Visit (INDEPENDENT_AMBULATORY_CARE_PROVIDER_SITE_OTHER): Payer: 59 | Admitting: Family Medicine

## 2017-02-05 ENCOUNTER — Other Ambulatory Visit: Payer: Self-pay

## 2017-02-05 ENCOUNTER — Ambulatory Visit (INDEPENDENT_AMBULATORY_CARE_PROVIDER_SITE_OTHER): Payer: 59

## 2017-02-05 VITALS — BP 156/74 | HR 85 | Temp 98.1°F | Ht 68.9 in | Wt 235.6 lb

## 2017-02-05 DIAGNOSIS — R05 Cough: Secondary | ICD-10-CM

## 2017-02-05 DIAGNOSIS — R059 Cough, unspecified: Secondary | ICD-10-CM

## 2017-02-05 DIAGNOSIS — J309 Allergic rhinitis, unspecified: Secondary | ICD-10-CM

## 2017-02-05 DIAGNOSIS — R062 Wheezing: Secondary | ICD-10-CM

## 2017-02-05 MED ORDER — ALBUTEROL SULFATE (2.5 MG/3ML) 0.083% IN NEBU: 2.5000 mg | INHALATION_SOLUTION | Freq: Once | RESPIRATORY_TRACT | Status: DC

## 2017-02-05 MED ORDER — ALBUTEROL SULFATE HFA 108 (90 BASE) MCG/ACT IN AERS: 2.0000 | INHALATION_SPRAY | Freq: Four times a day (QID) | RESPIRATORY_TRACT | 0 refills | Status: AC | PRN

## 2017-02-05 MED ORDER — BENZONATATE 200 MG PO CAPS: 200.0000 mg | ORAL_CAPSULE | Freq: Two times a day (BID) | ORAL | 0 refills | Status: DC | PRN

## 2017-02-05 MED FILL — BENZONATATE 200 MG CAP: 200 | 10 days supply | Qty: 20 | Fill #0

## 2017-02-05 MED FILL — VENTOLIN HFA 90 MCG INHALER: 108 (90 BAS | 25 days supply | Qty: 18 | Fill #0

## 2017-02-05 NOTE — Progress Notes (Signed)
1/8/201910:05 AM  Victoria Norton [birth year], 52 y.o. female [id number]  Chief Complaint  Patient presents with  . URI    cough, runny nose for 7 days    HPI:   Patient is a 52 y.o. female with past medical history significant for allergies undergoing immunotherapy who presents today for 7 days of runny nose, nasal congestion, cough that is worse at night, fatigue. Mild ear pressure, wheezing at night for past 2 days, no fever or chills. Reminds her of when she has "walking pneumonia in the past". Not using her nasacort. Has been exposed to significant dust recently,  Depression screen [facility] 2/9 02/05/2017 11/23/2016 05/22/2016  Decreased Interest 0 0 0  Down, Depressed, Hopeless 0 0 0  PHQ - 2 Score 0 0 0    Allergies  Allergen Reactions  . Lactose Diarrhea    Prior to Admission medications   Medication Sig Start Date End Date Taking? Authorizing Provider  amphetamine-dextroamphetamine (ADDERALL XR) 25 MG 24 hr capsule Take 1 capsule by mouth every morning. 11/23/16  Yes Jeffery, Chelle, PA-C  amphetamine-dextroamphetamine (ADDERALL XR) 25 MG 24 hr capsule Take 1 capsule by mouth every morning. 11/23/16  Yes Jeffery, Chelle, PA-C  amphetamine-dextroamphetamine (ADDERALL XR) 25 MG 24 hr capsule Take 1 capsule by mouth every morning. 11/23/16  Yes Jeffery, Chelle, PA-C  Cetirizine HCl (ZYRTEC ALLERGY PO) Take by mouth.   Yes [provider]  Cholecalciferol (D 1000) 1000 units CHEW Chew by mouth. 08/15/09  Yes [provider]  levocetirizine (XYZAL) 5 MG tablet  02/03/15  Yes [provider]  mometasone (NASONEX) 50 MCG/ACT nasal spray Place 2 sprays into the nose daily.   Yes [provider]  montelukast (SINGULAIR) 10 MG tablet Take 1 tablet (10 mg total) by mouth at bedtime. 07/22/14  Yes Jeffery, Chelle, PA-C  Omega-3 1000 MG CAPS Take by mouth. 08/15/09  Yes [provider]  pantoprazole (PROTONIX) 40 MG tablet Take 1 tablet (40 mg  total) by mouth daily. 11/23/16  Yes Jeffery, Chelle, PA-C  Vitamin D, Ergocalciferol, (DRISDOL) 50000 units CAPS capsule Take 1 capsule (50,000 Units total) by mouth every 7 (seven) days. 11/27/16  Yes Huel Cote, NP    Past Medical History:  Diagnosis Date  . ADD (attention deficit disorder)   . Allergy   . Anxiety and depression   . ASCUS (atypical squamous cells of undetermined significance) on Pap smear 2010   Neg HPV  . Depression   . Fibroadenoma of breast    LEFT  . GERD (gastroesophageal reflux disease)   . Kidney lesion    LEFT    Past Surgical History:  Procedure Laterality Date  . ANTERIOR CRUCIATE LIGAMENT REPAIR    . [place]  . EYE SURGERY    . TONSILLECTOMY      Social History   Tobacco Use  . Smoking status: Never Smoker  . Smokeless tobacco: Never Used  Substance Use Topics  . Alcohol use: Yes    Alcohol/week: 0.0 - 1.0 oz    Comment: social    Family History  Problem Relation Age of Onset  . Heart disease Maternal Grandmother   . Osteoporosis Maternal Grandmother   . Heart disease Paternal Grandmother   . Kidney disease Maternal Grandfather   . COPD Mother   . Colon cancer Neg Hx     ROS Per hpi  OBJECTIVE:  Blood pressure (!) 156/74, pulse 85, temperature 98.1 F (36.7 C),  temperature source Oral, height 5' 8.9" (1.75 m), weight 235 lb 9.6 oz (106.9 kg).  Physical Exam  Constitutional: She is oriented to person, place, and time and well-developed, well-nourished, and in no distress.  HENT:  Head: Normocephalic and atraumatic.  Right Ear: Hearing, tympanic membrane, external ear and ear canal normal.  Left Ear: Hearing, tympanic membrane, external ear and ear canal normal.  Nose: Rhinorrhea present. No mucosal edema. Right sinus exhibits no maxillary sinus tenderness and no frontal sinus tenderness. Left sinus exhibits no maxillary sinus tenderness and no frontal sinus tenderness.  Mouth/Throat: Oropharynx  is clear and moist.  Eyes: EOM are normal. Pupils are equal, round, and reactive to light.  Neck: Neck supple.  Cardiovascular: Normal rate, regular rhythm and normal heart sounds. Exam reveals no gallop and no friction rub.  No murmur heard. Pulmonary/Chest: Effort normal. She has wheezes in the right middle field and the right lower field. She has rhonchi in the right middle field. She has no rales.  Lymphadenopathy:    She has no cervical adenopathy.  Neurological: She is alert and oriented to person, place, and time. Gait normal.  Skin: Skin is warm and dry.   Lungs CTAB afte neb treatment   Dg Chest 2 View  Result Date: 02/05/2017 CLINICAL DATA:  Cough, wheezing EXAM: CHEST  2 VIEW COMPARISON:  None. FINDINGS: Heart and mediastinal contours are within normal limits. No focal opacities or effusions. No acute bony abnormality. IMPRESSION: No active cardiopulmonary disease. Electronically Signed   By: Rolm Baptise M.D.   On: 02/05/2017 10:32    ASSESSMENT and PLAN  1. Cough - DG Chest 2 View; Future  2. Wheezing - DG Chest 2 View; Future - albuterol (PROVENTIL) (2.5 MG/3ML) 0.083% nebulizer solution 2.5 mg  3. Allergic rhinitis, unspecified seasonality, unspecified trigger Seems allergies recently triggered by dust. Discussed restarting nasacort. Has upcoming appt with allergist. Discussed use of albuterol inhaler. RTC precautions reviewed.  Other orders - albuterol (PROVENTIL HFA;VENTOLIN HFA) 108 (90 Base) MCG/ACT inhaler; Inhale 2 puffs into the lungs every 6 (six) hours as needed for wheezing or shortness of breath. - benzonatate (TESSALON) 200 MG capsule; Take 1 capsule (200 mg total) by mouth 2 (two) times daily as needed for cough.  Return if symptoms worsen or fail to improve.    Rutherford Guys, MD Primary Care at [place], [place] [address] Ph.  [phone] Fax [phone]

## 2017-02-05 NOTE — Patient Instructions (Signed)
     IF you received an x-ray today, you will receive an invoice from [facility]. Please contact [facility] at [phone] with questions or concerns regarding your invoice.   IF you received labwork today, you will receive an invoice from [facility]. Please contact [facility] at [phone] with questions or concerns regarding your invoice.   Our billing staff will not be able to assist you with questions regarding bills from these companies.  You will be contacted with the lab results as soon as they are available. The fastest way to get your results is to activate your My Chart account. Instructions are located on the last page of this paperwork. If you have not heard from us regarding the results in 2 weeks, please contact this office.     

## 2017-02-13 DIAGNOSIS — F4321 Adjustment disorder with depressed mood: Secondary | ICD-10-CM | POA: Diagnosis not present

## 2017-02-15 DIAGNOSIS — H43811 Vitreous degeneration, right eye: Secondary | ICD-10-CM | POA: Diagnosis not present

## 2017-02-22 MED FILL — PANTOPRAZOLE SOD DR 40 MG T: 40 | 90 days supply | Qty: 90 | Fill #0

## 2017-02-22 MED FILL — ADDERALL XR 25 MG CAPSULE: 25 | 30 days supply | Qty: 30 | Fill #0

## 2017-02-25 DIAGNOSIS — F4321 Adjustment disorder with depressed mood: Secondary | ICD-10-CM | POA: Diagnosis not present

## 2017-03-04 DIAGNOSIS — F4321 Adjustment disorder with depressed mood: Secondary | ICD-10-CM | POA: Diagnosis not present

## 2017-03-11 DIAGNOSIS — F4321 Adjustment disorder with depressed mood: Secondary | ICD-10-CM | POA: Diagnosis not present

## 2017-03-18 DIAGNOSIS — J3089 Other allergic rhinitis: Secondary | ICD-10-CM | POA: Diagnosis not present

## 2017-03-18 DIAGNOSIS — H1045 Other chronic allergic conjunctivitis: Secondary | ICD-10-CM | POA: Diagnosis not present

## 2017-03-18 DIAGNOSIS — J301 Allergic rhinitis due to pollen: Secondary | ICD-10-CM | POA: Diagnosis not present

## 2017-03-18 DIAGNOSIS — K219 Gastro-esophageal reflux disease without esophagitis: Secondary | ICD-10-CM | POA: Diagnosis not present

## 2017-03-25 DIAGNOSIS — F4321 Adjustment disorder with depressed mood: Secondary | ICD-10-CM | POA: Diagnosis not present

## 2017-03-26 DIAGNOSIS — J3089 Other allergic rhinitis: Secondary | ICD-10-CM | POA: Diagnosis not present

## 2017-03-26 DIAGNOSIS — J301 Allergic rhinitis due to pollen: Secondary | ICD-10-CM | POA: Diagnosis not present

## 2017-03-29 ENCOUNTER — Other Ambulatory Visit: Payer: Self-pay | Admitting: Physician Assistant

## 2017-03-29 DIAGNOSIS — F988 Other specified behavioral and emotional disorders with onset usually occurring in childhood and adolescence: Secondary | ICD-10-CM

## 2017-03-29 MED ORDER — AMPHETAMINE-DEXTROAMPHET ER 25 MG PO CP24: 25.0000 mg | ORAL_CAPSULE | ORAL | 0 refills | Status: DC

## 2017-03-29 NOTE — Telephone Encounter (Signed)
Please advise 

## 2017-03-29 NOTE — Telephone Encounter (Signed)
Rx sent electronically.  Meds ordered this encounter  Medications  . amphetamine-dextroamphetamine (ADDERALL XR) 25 MG 24 hr capsule    Sig: Take 1 capsule by mouth every morning.    Dispense:  30 capsule    Refill:  0    BRAND NAME ADDERALL  . amphetamine-dextroamphetamine (ADDERALL XR) 25 MG 24 hr capsule    Sig: Take 1 capsule by mouth every morning.    Dispense:  30 capsule    Refill:  0    BRAND NAME ADDERALL. May fill 30 days after date on prescription.  Marland Kitchen amphetamine-dextroamphetamine (ADDERALL XR) 25 MG 24 hr capsule    Sig: Take 1 capsule by mouth every morning.    Dispense:  30 capsule    Refill:  0    BRAND NAME ADDERALL. May fill 60 days after date on prescription

## 2017-04-01 MED FILL — ADDERALL XR 25 MG CAPSULE: 25 | 30 days supply | Qty: 30 | Fill #0

## 2017-04-02 DIAGNOSIS — F4321 Adjustment disorder with depressed mood: Secondary | ICD-10-CM | POA: Diagnosis not present

## 2017-04-08 DIAGNOSIS — F4321 Adjustment disorder with depressed mood: Secondary | ICD-10-CM | POA: Diagnosis not present

## 2017-04-15 DIAGNOSIS — F4321 Adjustment disorder with depressed mood: Secondary | ICD-10-CM | POA: Diagnosis not present

## 2017-04-17 DIAGNOSIS — J301 Allergic rhinitis due to pollen: Secondary | ICD-10-CM | POA: Diagnosis not present

## 2017-04-17 DIAGNOSIS — J3089 Other allergic rhinitis: Secondary | ICD-10-CM | POA: Diagnosis not present

## 2017-04-24 ENCOUNTER — Other Ambulatory Visit: Payer: Self-pay

## 2017-04-24 ENCOUNTER — Encounter: Payer: Self-pay | Admitting: Physician Assistant

## 2017-04-24 ENCOUNTER — Ambulatory Visit: Payer: 59 | Admitting: Physician Assistant

## 2017-04-24 VITALS — BP 116/80 | HR 86 | Temp 98.8°F | Resp 16 | Ht 68.9 in | Wt 239.0 lb

## 2017-04-24 DIAGNOSIS — F4321 Adjustment disorder with depressed mood: Secondary | ICD-10-CM | POA: Diagnosis not present

## 2017-04-24 DIAGNOSIS — F329 Major depressive disorder, single episode, unspecified: Secondary | ICD-10-CM | POA: Diagnosis not present

## 2017-04-24 DIAGNOSIS — F988 Other specified behavioral and emotional disorders with onset usually occurring in childhood and adolescence: Secondary | ICD-10-CM

## 2017-04-24 DIAGNOSIS — F419 Anxiety disorder, unspecified: Secondary | ICD-10-CM | POA: Diagnosis not present

## 2017-04-24 MED ORDER — BUPROPION HCL ER (XL) 150 MG PO TB24: 150.0000 mg | ORAL_TABLET | Freq: Every day | ORAL | 3 refills | Status: DC

## 2017-04-24 NOTE — Patient Instructions (Addendum)
Continue the Adderall for now. We may elect to reduce, stop or change it in the future. Start the bupropion XL (Wellbutrin XL), once daily in the morning.    IF you received an x-ray today, you will receive an invoice from [facility]. Please contact [facility] at [phone] with questions or concerns regarding your invoice.   IF you received labwork today, you will receive an invoice from [facility]. Please contact [facility] at [phone] with questions or concerns regarding your invoice.   Our billing staff will not be able to assist you with questions regarding bills from these companies.  You will be contacted with the lab results as soon as they are available. The fastest way to get your results is to activate your My Chart account. Instructions are located on the last page of this paperwork. If you have not heard from [place] regarding the results in 2 weeks, please contact this office.
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
## 2017-04-25 MED FILL — buPROPion HCL ER (XL) 150 M: 150 | 90 days supply | Qty: 90 | Fill #0

## 2017-04-25 NOTE — Assessment & Plan Note (Signed)
Stable. COntinue current treatment with Adderall.

## 2017-04-25 NOTE — Assessment & Plan Note (Signed)
Discussed treatment options. Elect against SSRI initially, due to fatigue. Trial of bupropion.

## 2017-04-26 DIAGNOSIS — J301 Allergic rhinitis due to pollen: Secondary | ICD-10-CM | POA: Diagnosis not present

## 2017-04-26 DIAGNOSIS — J3089 Other allergic rhinitis: Secondary | ICD-10-CM | POA: Diagnosis not present

## 2017-04-30 DIAGNOSIS — F4321 Adjustment disorder with depressed mood: Secondary | ICD-10-CM | POA: Diagnosis not present

## 2017-05-02 DIAGNOSIS — J301 Allergic rhinitis due to pollen: Secondary | ICD-10-CM | POA: Diagnosis not present

## 2017-05-02 DIAGNOSIS — J3089 Other allergic rhinitis: Secondary | ICD-10-CM | POA: Diagnosis not present

## 2017-05-03 MED FILL — ADDERALL XR 25 MG CAPSULE: 25 | 30 days supply | Qty: 30 | Fill #0

## 2017-05-06 ENCOUNTER — Encounter: Payer: Self-pay | Admitting: Physician Assistant

## 2017-05-08 DIAGNOSIS — F4321 Adjustment disorder with depressed mood: Secondary | ICD-10-CM | POA: Diagnosis not present

## 2017-05-13 DIAGNOSIS — F4321 Adjustment disorder with depressed mood: Secondary | ICD-10-CM | POA: Diagnosis not present

## 2017-05-14 DIAGNOSIS — J3081 Allergic rhinitis due to animal (cat) (dog) hair and dander: Secondary | ICD-10-CM | POA: Diagnosis not present

## 2017-05-14 DIAGNOSIS — J3089 Other allergic rhinitis: Secondary | ICD-10-CM | POA: Diagnosis not present

## 2017-05-14 DIAGNOSIS — J301 Allergic rhinitis due to pollen: Secondary | ICD-10-CM | POA: Diagnosis not present

## 2017-05-22 DIAGNOSIS — F4321 Adjustment disorder with depressed mood: Secondary | ICD-10-CM | POA: Diagnosis not present

## 2017-05-27 DIAGNOSIS — F4321 Adjustment disorder with depressed mood: Secondary | ICD-10-CM | POA: Diagnosis not present

## 2017-05-29 ENCOUNTER — Encounter: Payer: Self-pay | Admitting: Physician Assistant

## 2017-05-29 ENCOUNTER — Other Ambulatory Visit: Payer: Self-pay

## 2017-05-29 ENCOUNTER — Ambulatory Visit: Payer: 59 | Admitting: Physician Assistant

## 2017-05-29 VITALS — BP 122/80 | HR 74 | Temp 98.3°F | Resp 16 | Ht 68.9 in | Wt 238.4 lb

## 2017-05-29 DIAGNOSIS — F329 Major depressive disorder, single episode, unspecified: Secondary | ICD-10-CM | POA: Diagnosis not present

## 2017-05-29 DIAGNOSIS — F419 Anxiety disorder, unspecified: Secondary | ICD-10-CM

## 2017-05-29 DIAGNOSIS — F988 Other specified behavioral and emotional disorders with onset usually occurring in childhood and adolescence: Secondary | ICD-10-CM | POA: Diagnosis not present

## 2017-05-29 DIAGNOSIS — J301 Allergic rhinitis due to pollen: Secondary | ICD-10-CM | POA: Diagnosis not present

## 2017-05-29 DIAGNOSIS — J3089 Other allergic rhinitis: Secondary | ICD-10-CM | POA: Diagnosis not present

## 2017-05-29 MED ORDER — AMPHETAMINE-DEXTROAMPHET ER 25 MG PO CP24: 25.0000 mg | ORAL_CAPSULE | ORAL | 0 refills | Status: AC

## 2017-05-29 NOTE — Patient Instructions (Addendum)
Do some reading about teenage brain development. Check out the [facility] app Consider the root of your rules and consequences for breaking them, make sure they match.    IF you received an x-ray today, you will receive an invoice from [facility]. Please contact [facility] at [phone] with questions or concerns regarding your invoice.   IF you received labwork today, you will receive an invoice from [facility]. Please contact [facility] at [phone] with questions or concerns regarding your invoice.   Our billing staff will not be able to assist you with questions regarding bills from these companies.  You will be contacted with the lab results as soon as they are available. The fastest way to get your results is to activate your My Chart account. Instructions are located on the last page of this paperwork. If you have not heard from [place] regarding the results in 2 weeks, please contact this office.

## 2017-05-29 NOTE — Progress Notes (Signed)
Patient ID: Victoria Norton, female    DOB: [birth year], 52 y.o.   MRN: [id number]  PCP: Harrison Mons, PA-C  Chief Complaint  Patient presents with  . Anxiety    follow up   . Depression    follow up     Subjective:   Presents for evaluation of anxiety and depression.  Doing a lot better on bupropion. Struggles with her son keep her from being totally well. Found vaping paraphernalia in his room, and he revealed he's been smoking marijuana wrapped in nicotine. He has also obtained "clean" pee for a urine test. Sons are 58 and 33 years old and both live at home. Discussing alternative consequences/methods of monitoring nicotine use.  Working on [employer], having elevated cholesterol at last GYN visit. Considering red yeast rice, vegetarian diet.  Review of Systems   Depression screen [facility] 2/9 05/29/2017 04/24/2017 02/05/2017 11/23/2016 05/22/2016  Decreased Interest 0 3 0 0 0  Down, Depressed, Hopeless 1 3 0 0 0  PHQ - 2 Score 1 6 0 0 0  Altered sleeping 1 3 - - -  Tired, decreased energy 1 3 - - -  Change in appetite 0 3 - - -  Feeling bad or failure about yourself  1 3 - - -  Trouble concentrating 0 0 - - -  Moving slowly or fidgety/restless 0 0 - - -  Suicidal thoughts 0 0 - - -  PHQ-9 Score 4 18 - - -  Difficult doing work/chores Not difficult at all Extremely dIfficult - - -     Patient Active Problem List   Diagnosis Date Noted  . Anemia 08/23/2015  . Vitamin D deficiency 08/23/2015  . GERD (gastroesophageal reflux disease) 08/31/2014  . Hyperlipidemia 07/22/2014  . Obesity (BMI 30-39.9) 04/21/2013  . AR (allergic rhinitis) 01/03/2012  . Anxiety and depression   . ADD (attention deficit disorder)   . Fibroadenoma of breast   . Kidney lesion   . DUB (dysfunctional uterine bleeding) 08/29/2011     Prior to Admission medications   Medication Sig Start Date End Date Taking? Authorizing Provider  albuterol (PROVENTIL HFA;VENTOLIN HFA) 108 (90  Base) MCG/ACT inhaler Inhale 2 puffs into the lungs every 6 (six) hours as needed for wheezing or shortness of breath. 02/05/17   Rutherford Guys, MD  amphetamine-dextroamphetamine (ADDERALL XR) 25 MG 24 hr capsule Take 1 capsule by mouth every morning. Patient not taking: Reported on 04/24/2017 03/29/17   Harrison Mons, PA-C  amphetamine-dextroamphetamine (ADDERALL XR) 25 MG 24 hr capsule Take 1 capsule by mouth every morning. 03/29/17   Harrison Mons, PA-C  buPROPion (WELLBUTRIN XL) 150 MG 24 hr tablet Take 1 tablet (150 mg total) by mouth daily. 04/24/17   Harrison Mons, PA-C  Cetirizine HCl (ZYRTEC ALLERGY PO) Take by mouth.    [provider]  Cholecalciferol (D 1000) 1000 units CHEW Chew by mouth. 08/15/09   [provider]  levocetirizine (XYZAL) 5 MG tablet  02/03/15   [provider]  mometasone (NASONEX) 50 MCG/ACT nasal spray Place 2 sprays into the nose daily.    [provider]  Omega-3 1000 MG CAPS Take by mouth. 08/15/09   [provider]  pantoprazole (PROTONIX) 40 MG tablet Take 1 tablet (40 mg total) by mouth daily. 11/23/16   Harrison Mons, PA-C     Allergies  Allergen Reactions  . Lactose Diarrhea       Objective:  Physical Exam  Constitutional:  She is oriented to person, place, and time. She appears well-developed and well-nourished. She is active and cooperative. No distress.  BP 122/80   Pulse 74   Temp 98.3 F (36.8 C)   Resp 16   Ht 5' 8.9" (1.75 m)   Wt 238 lb 6.4 oz (108.1 kg)   SpO2 98%   BMI 35.31 kg/m    Eyes: Conjunctivae are normal.  Pulmonary/Chest: Effort normal.  Neurological: She is alert and oriented to person, place, and time.  Psychiatric: She has a normal mood and affect. Her speech is normal and behavior is normal.       Wt Readings from Last 3 Encounters:  05/29/17 238 lb 6.4 oz (108.1 kg)  04/24/17 239 lb (108.4 kg)  02/05/17 235 lb 9.6 oz (106.9 kg)       Assessment & Plan:    Problem List Items Addressed This Visit    Anxiety and depression - Primary   ADD (attention deficit disorder)   Relevant Medications   amphetamine-dextroamphetamine (ADDERALL XR) 25 MG 24 hr capsule   amphetamine-dextroamphetamine (ADDERALL XR) 25 MG 24 hr capsule   amphetamine-dextroamphetamine (ADDERALL XR) 25 MG 24 hr capsule       Return in about 3 months (around 08/29/2017) for re-evalaution of mood and ADD.   Fara Chute, PA-C Primary Care at [place]

## 2017-05-31 MED FILL — ADDERALL XR 25 MG CAPSULE: 25 | 30 days supply | Qty: 30 | Fill #0

## 2017-06-02 NOTE — Assessment & Plan Note (Signed)
Doing well with medications. COntinue counseling. Specifically address parenting of her teenage sons using nicotine in her home.

## 2017-06-02 NOTE — Assessment & Plan Note (Signed)
Well controlled 

## 2017-06-03 ENCOUNTER — Encounter: Payer: 59 | Admitting: Women's Health

## 2017-06-04 ENCOUNTER — Ambulatory Visit: Payer: 59 | Admitting: Physician Assistant

## 2017-06-04 MED FILL — LEVOCETIRIZINE 5 MG TABLET: 5 | 90 days supply | Qty: 90 | Fill #0

## 2017-06-05 DIAGNOSIS — F4321 Adjustment disorder with depressed mood: Secondary | ICD-10-CM | POA: Diagnosis not present

## 2017-06-06 ENCOUNTER — Telehealth: Payer: Self-pay | Admitting: Physician Assistant

## 2017-06-06 DIAGNOSIS — F329 Major depressive disorder, single episode, unspecified: Secondary | ICD-10-CM

## 2017-06-06 DIAGNOSIS — F32A Depression, unspecified: Secondary | ICD-10-CM

## 2017-06-06 DIAGNOSIS — F419 Anxiety disorder, unspecified: Principal | ICD-10-CM

## 2017-06-06 NOTE — Telephone Encounter (Signed)
Spoke with pt. They would like to increase their current dose of wellbutrin. Pt has spoken with her therapist and believes this is what's best based on the current stressors in her life. Advised that I would pass this message along and that [place] would respond via MyChart is there is anything further we need to go over.

## 2017-06-06 NOTE — Telephone Encounter (Signed)
Copied from [place] [id number]. Topic: Quick Communication - See Telephone Encounter >> Jun 06, 2017  2:01 PM Ivar Drape wrote: CRM for notification. See Telephone encounter for: 06/06/17. Patient would like a call back to discuss the changing of the doses of her medications.

## 2017-06-07 MED ORDER — BUPROPION HCL ER (XL) 300 MG PO TB24: 300.0000 mg | ORAL_TABLET | Freq: Every day | ORAL | 3 refills | Status: AC

## 2017-06-11 DIAGNOSIS — J3089 Other allergic rhinitis: Secondary | ICD-10-CM | POA: Diagnosis not present

## 2017-06-11 DIAGNOSIS — J301 Allergic rhinitis due to pollen: Secondary | ICD-10-CM | POA: Diagnosis not present

## 2017-06-12 DIAGNOSIS — F4321 Adjustment disorder with depressed mood: Secondary | ICD-10-CM | POA: Diagnosis not present

## 2017-06-18 DIAGNOSIS — J3089 Other allergic rhinitis: Secondary | ICD-10-CM | POA: Diagnosis not present

## 2017-06-18 DIAGNOSIS — J301 Allergic rhinitis due to pollen: Secondary | ICD-10-CM | POA: Diagnosis not present

## 2017-06-19 DIAGNOSIS — F4321 Adjustment disorder with depressed mood: Secondary | ICD-10-CM | POA: Diagnosis not present

## 2017-06-26 DIAGNOSIS — F4321 Adjustment disorder with depressed mood: Secondary | ICD-10-CM | POA: Diagnosis not present

## 2017-06-26 DIAGNOSIS — J301 Allergic rhinitis due to pollen: Secondary | ICD-10-CM | POA: Diagnosis not present

## 2017-06-27 DIAGNOSIS — J3089 Other allergic rhinitis: Secondary | ICD-10-CM | POA: Diagnosis not present

## 2017-06-27 DIAGNOSIS — J301 Allergic rhinitis due to pollen: Secondary | ICD-10-CM | POA: Diagnosis not present

## 2017-06-27 MED FILL — PANTOPRAZOLE SOD DR 40 MG T: 40 | 90 days supply | Qty: 90 | Fill #1

## 2017-06-28 MED FILL — ADDERALL XR 25 MG CAPSULE: 25 | 30 days supply | Qty: 30 | Fill #0

## 2017-07-01 DIAGNOSIS — F4321 Adjustment disorder with depressed mood: Secondary | ICD-10-CM | POA: Diagnosis not present

## 2017-07-04 MED FILL — buPROPion HCL ER (XL) 300 M: 300 | 90 days supply | Qty: 90 | Fill #0

## 2017-07-05 DIAGNOSIS — J3089 Other allergic rhinitis: Secondary | ICD-10-CM | POA: Diagnosis not present

## 2017-07-05 DIAGNOSIS — J301 Allergic rhinitis due to pollen: Secondary | ICD-10-CM | POA: Diagnosis not present

## 2017-07-08 DIAGNOSIS — F4321 Adjustment disorder with depressed mood: Secondary | ICD-10-CM | POA: Diagnosis not present

## 2017-07-15 DIAGNOSIS — F4321 Adjustment disorder with depressed mood: Secondary | ICD-10-CM | POA: Diagnosis not present

## 2017-07-22 DIAGNOSIS — F4321 Adjustment disorder with depressed mood: Secondary | ICD-10-CM | POA: Diagnosis not present

## 2017-07-25 DIAGNOSIS — J301 Allergic rhinitis due to pollen: Secondary | ICD-10-CM | POA: Diagnosis not present

## 2017-07-25 DIAGNOSIS — J3089 Other allergic rhinitis: Secondary | ICD-10-CM | POA: Diagnosis not present

## 2017-08-05 ENCOUNTER — Encounter (HOSPITAL_COMMUNITY): Payer: Self-pay | Admitting: Emergency Medicine

## 2017-08-05 ENCOUNTER — Ambulatory Visit (HOSPITAL_COMMUNITY)
Admission: EM | Admit: 2017-08-05 | Discharge: 2017-08-05 | Disposition: A | Discharge status: Home / Self Care | Payer: 59 | Attending: Family Medicine | Admitting: Family Medicine

## 2017-08-05 DIAGNOSIS — R0789 Other chest pain: Secondary | ICD-10-CM

## 2017-08-05 DIAGNOSIS — R42 Dizziness and giddiness: Secondary | ICD-10-CM | POA: Diagnosis not present

## 2017-08-05 DIAGNOSIS — M79605 Pain in left leg: Secondary | ICD-10-CM

## 2017-08-05 DIAGNOSIS — M79602 Pain in left arm: Secondary | ICD-10-CM

## 2017-08-05 MED ORDER — NAPROXEN 500 MG PO TABS: 500.0000 mg | ORAL_TABLET | Freq: Two times a day (BID) | ORAL | 0 refills | Status: AC

## 2017-08-05 NOTE — ED Triage Notes (Signed)
Pt here for multiple symptoms including leg pain, arm pain, cough, blurry vision

## 2017-08-05 NOTE — Discharge Instructions (Signed)
Use anti-inflammatories for pain/swelling. You may take up to 800 mg Ibuprofen every 8 hours with food. You may supplement Ibuprofen with Tylenol [phone] mg every 8 hours. OR Naprosyn/Alleeve as needed  Wrist brace for wrist  Follow up with PCP and ophthalmology  Please return or go to emergency room if developing vision changes, chest pain, shortness of breath, worsening pain, worsening headache
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
## 2017-08-09 MED FILL — ADDERALL XR 25 MG CAPSULE: 25 | 30 days supply | Qty: 30 | Fill #0

## 2017-08-13 DIAGNOSIS — F4321 Adjustment disorder with depressed mood: Secondary | ICD-10-CM | POA: Diagnosis not present

## 2017-08-19 DIAGNOSIS — F4321 Adjustment disorder with depressed mood: Secondary | ICD-10-CM | POA: Diagnosis not present

## 2017-08-26 DIAGNOSIS — F4321 Adjustment disorder with depressed mood: Secondary | ICD-10-CM | POA: Diagnosis not present

## 2017-08-30 MED FILL — LEVOCETIRIZINE 5 MG TABLET: 5 | 90 days supply | Qty: 90 | Fill #1

## 2017-09-10 MED FILL — ADDERALL XR 25 MG CAPSULE: 25 | 30 days supply | Qty: 30 | Fill #0

## 2017-09-11 DIAGNOSIS — F4321 Adjustment disorder with depressed mood: Secondary | ICD-10-CM | POA: Diagnosis not present

## 2017-09-16 DIAGNOSIS — F4321 Adjustment disorder with depressed mood: Secondary | ICD-10-CM | POA: Diagnosis not present

## 2017-09-23 DIAGNOSIS — F4321 Adjustment disorder with depressed mood: Secondary | ICD-10-CM | POA: Diagnosis not present

## 2017-09-26 MED FILL — MOMETASONE FUROATE 50 MCG S: 50 | 30 days supply | Qty: 17 | Fill #1

## 2017-10-04 MED FILL — buPROPion HCL ER (XL) 300 M: 300 | 90 days supply | Qty: 90 | Fill #1

## 2017-10-07 DIAGNOSIS — F4321 Adjustment disorder with depressed mood: Secondary | ICD-10-CM | POA: Diagnosis not present

## 2017-10-18 DIAGNOSIS — F988 Other specified behavioral and emotional disorders with onset usually occurring in childhood and adolescence: Secondary | ICD-10-CM | POA: Diagnosis not present

## 2017-10-18 DIAGNOSIS — F329 Major depressive disorder, single episode, unspecified: Secondary | ICD-10-CM | POA: Diagnosis not present

## 2017-10-18 DIAGNOSIS — F419 Anxiety disorder, unspecified: Secondary | ICD-10-CM | POA: Diagnosis not present

## 2017-10-18 MED FILL — ADDERALL XR 25 MG CAPSULE: 25 | 30 days supply | Qty: 30 | Fill #0

## 2017-10-21 DIAGNOSIS — F4321 Adjustment disorder with depressed mood: Secondary | ICD-10-CM | POA: Diagnosis not present

## 2017-11-06 DIAGNOSIS — F4321 Adjustment disorder with depressed mood: Secondary | ICD-10-CM | POA: Diagnosis not present

## 2017-11-08 MED FILL — PANTOPRAZOLE SOD DR 40 MG T: 40 | 90 days supply | Qty: 90 | Fill #2

## 2017-11-21 MED FILL — ADDERALL XR 25 MG CAPSULE: 25 | 30 days supply | Qty: 30 | Fill #0

## 2017-12-10 MED FILL — LEVOCETIRIZINE 5 MG TABLET: 5 | 90 days supply | Qty: 90 | Fill #0

## 2017-12-16 DIAGNOSIS — F4321 Adjustment disorder with depressed mood: Secondary | ICD-10-CM | POA: Diagnosis not present

## 2017-12-30 DIAGNOSIS — F4321 Adjustment disorder with depressed mood: Secondary | ICD-10-CM | POA: Diagnosis not present

## 2017-12-31 MED FILL — ADDERALL XR 25 MG CAPSULE: 25 | 30 days supply | Qty: 30 | Fill #0

## 2018-01-06 DIAGNOSIS — F4321 Adjustment disorder with depressed mood: Secondary | ICD-10-CM | POA: Diagnosis not present

## 2018-01-15 DIAGNOSIS — F4321 Adjustment disorder with depressed mood: Secondary | ICD-10-CM | POA: Diagnosis not present

## 2018-01-17 MED FILL — buPROPion HCL ER (XL) 300 M: 300 | 90 days supply | Qty: 90 | Fill #2

## 2018-02-06 DIAGNOSIS — F4321 Adjustment disorder with depressed mood: Secondary | ICD-10-CM | POA: Diagnosis not present

## 2018-02-11 MED FILL — ADDERALL XR 25 MG CAPSULE: 25 | 30 days supply | Qty: 30 | Fill #0

## 2018-02-17 DIAGNOSIS — F4321 Adjustment disorder with depressed mood: Secondary | ICD-10-CM | POA: Diagnosis not present

## 2018-03-17 DIAGNOSIS — F4321 Adjustment disorder with depressed mood: Secondary | ICD-10-CM | POA: Diagnosis not present

## 2018-03-19 DIAGNOSIS — K219 Gastro-esophageal reflux disease without esophagitis: Secondary | ICD-10-CM | POA: Diagnosis not present

## 2018-03-19 DIAGNOSIS — E559 Vitamin D deficiency, unspecified: Secondary | ICD-10-CM | POA: Diagnosis not present

## 2018-03-19 DIAGNOSIS — J3089 Other allergic rhinitis: Secondary | ICD-10-CM | POA: Diagnosis not present

## 2018-03-19 DIAGNOSIS — Z0000 Encounter for general adult medical examination without abnormal findings: Secondary | ICD-10-CM | POA: Diagnosis not present

## 2018-03-19 DIAGNOSIS — F419 Anxiety disorder, unspecified: Secondary | ICD-10-CM | POA: Diagnosis not present

## 2018-03-19 DIAGNOSIS — D649 Anemia, unspecified: Secondary | ICD-10-CM | POA: Diagnosis not present

## 2018-03-19 DIAGNOSIS — F988 Other specified behavioral and emotional disorders with onset usually occurring in childhood and adolescence: Secondary | ICD-10-CM | POA: Diagnosis not present

## 2018-03-19 DIAGNOSIS — E785 Hyperlipidemia, unspecified: Secondary | ICD-10-CM | POA: Diagnosis not present

## 2018-03-19 DIAGNOSIS — E669 Obesity, unspecified: Secondary | ICD-10-CM | POA: Diagnosis not present

## 2018-03-19 MED FILL — ADDERALL XR 25 MG CAPSULE: 25 | 30 days supply | Qty: 30 | Fill #0

## 2018-03-19 MED FILL — PANTOPRAZOLE SOD DR 40 MG T: 40 | 90 days supply | Qty: 90 | Fill #0

## 2018-03-19 MED FILL — LEVOCETIRIZINE 5 MG TABLET: 5 | 90 days supply | Qty: 90 | Fill #0

## 2018-03-26 DIAGNOSIS — F4321 Adjustment disorder with depressed mood: Secondary | ICD-10-CM | POA: Diagnosis not present

## 2018-04-09 DIAGNOSIS — F4321 Adjustment disorder with depressed mood: Secondary | ICD-10-CM | POA: Diagnosis not present

## 2018-04-14 DIAGNOSIS — F4321 Adjustment disorder with depressed mood: Secondary | ICD-10-CM | POA: Diagnosis not present

## 2018-04-21 DIAGNOSIS — F4321 Adjustment disorder with depressed mood: Secondary | ICD-10-CM | POA: Diagnosis not present

## 2018-04-22 MED FILL — ADDERALL XR 25 MG CAPSULE: 25 | 30 days supply | Qty: 30 | Fill #0

## 2018-04-23 MED FILL — buPROPion HCL ER (XL) 300 M: 300 | 90 days supply | Qty: 90 | Fill #3

## 2018-04-28 DIAGNOSIS — F4321 Adjustment disorder with depressed mood: Secondary | ICD-10-CM | POA: Diagnosis not present

## 2018-05-19 DIAGNOSIS — F4321 Adjustment disorder with depressed mood: Secondary | ICD-10-CM | POA: Diagnosis not present

## 2018-05-23 MED FILL — ADDERALL XR 25 MG CAPSULE: 25 | 30 days supply | Qty: 30 | Fill #0

## 2018-05-26 DIAGNOSIS — F4321 Adjustment disorder with depressed mood: Secondary | ICD-10-CM | POA: Diagnosis not present

## 2018-06-02 DIAGNOSIS — F4321 Adjustment disorder with depressed mood: Secondary | ICD-10-CM | POA: Diagnosis not present

## 2018-06-09 DIAGNOSIS — F4321 Adjustment disorder with depressed mood: Secondary | ICD-10-CM | POA: Diagnosis not present

## 2018-06-16 DIAGNOSIS — F4321 Adjustment disorder with depressed mood: Secondary | ICD-10-CM | POA: Diagnosis not present

## 2018-06-17 DIAGNOSIS — F988 Other specified behavioral and emotional disorders with onset usually occurring in childhood and adolescence: Secondary | ICD-10-CM | POA: Diagnosis not present

## 2018-06-17 DIAGNOSIS — F329 Major depressive disorder, single episode, unspecified: Secondary | ICD-10-CM | POA: Diagnosis not present

## 2018-06-17 DIAGNOSIS — R232 Flushing: Secondary | ICD-10-CM | POA: Diagnosis not present

## 2018-06-17 DIAGNOSIS — F419 Anxiety disorder, unspecified: Secondary | ICD-10-CM | POA: Diagnosis not present

## 2018-06-18 MED FILL — ADDERALL XR 25 MG CAPSULE: 25 | 30 days supply | Qty: 30 | Fill #0

## 2018-06-26 MED FILL — LEVOCETIRIZINE 5 MG TABLET: 5 | 90 days supply | Qty: 90 | Fill #1

## 2018-06-26 MED FILL — PANTOPRAZOLE SOD DR 40 MG T: 40 | 90 days supply | Qty: 90 | Fill #1

## 2018-06-30 DIAGNOSIS — F4321 Adjustment disorder with depressed mood: Secondary | ICD-10-CM | POA: Diagnosis not present

## 2018-07-07 DIAGNOSIS — F4321 Adjustment disorder with depressed mood: Secondary | ICD-10-CM | POA: Diagnosis not present

## 2018-07-14 DIAGNOSIS — F4321 Adjustment disorder with depressed mood: Secondary | ICD-10-CM | POA: Diagnosis not present

## 2018-07-28 DIAGNOSIS — F4321 Adjustment disorder with depressed mood: Secondary | ICD-10-CM | POA: Diagnosis not present

## 2018-07-30 MED FILL — buPROPion HCL ER (XL) 300 M: 300 | 90 days supply | Qty: 90 | Fill #0

## 2018-07-31 MED FILL — ADDERALL XR 25 MG CAPSULE: 25 | 30 days supply | Qty: 30 | Fill #0

## 2018-08-11 DIAGNOSIS — F4321 Adjustment disorder with depressed mood: Secondary | ICD-10-CM | POA: Diagnosis not present

## 2018-08-21 DIAGNOSIS — F4321 Adjustment disorder with depressed mood: Secondary | ICD-10-CM | POA: Diagnosis not present

## 2018-09-01 DIAGNOSIS — F4321 Adjustment disorder with depressed mood: Secondary | ICD-10-CM | POA: Diagnosis not present

## 2018-09-09 DIAGNOSIS — F4321 Adjustment disorder with depressed mood: Secondary | ICD-10-CM | POA: Diagnosis not present

## 2018-09-12 MED FILL — ADDERALL XR 25 MG CAPSULE: 25 | 30 days supply | Qty: 30 | Fill #0

## 2018-09-15 DIAGNOSIS — F4321 Adjustment disorder with depressed mood: Secondary | ICD-10-CM | POA: Diagnosis not present

## 2018-09-25 DIAGNOSIS — F4321 Adjustment disorder with depressed mood: Secondary | ICD-10-CM | POA: Diagnosis not present

## 2018-10-02 DIAGNOSIS — F4321 Adjustment disorder with depressed mood: Secondary | ICD-10-CM | POA: Diagnosis not present

## 2018-10-07 MED FILL — PANTOPRAZOLE SOD DR 40 MG T: 40 | 90 days supply | Qty: 90 | Fill #2

## 2018-10-07 MED FILL — LEVOCETIRIZINE 5 MG TABLET: 5 | 90 days supply | Qty: 90 | Fill #2

## 2018-10-10 MED FILL — ADDERALL XR 25 MG CAPSULE: 25 | 30 days supply | Qty: 30 | Fill #0

## 2018-10-13 DIAGNOSIS — F4321 Adjustment disorder with depressed mood: Secondary | ICD-10-CM | POA: Diagnosis not present

## 2018-10-22 DIAGNOSIS — F4321 Adjustment disorder with depressed mood: Secondary | ICD-10-CM | POA: Diagnosis not present

## 2018-11-05 DIAGNOSIS — F4321 Adjustment disorder with depressed mood: Secondary | ICD-10-CM | POA: Diagnosis not present

## 2018-11-10 MED FILL — buPROPion HCL ER (XL) 300 M: 300 | 90 days supply | Qty: 90 | Fill #1

## 2018-11-12 DIAGNOSIS — J3089 Other allergic rhinitis: Secondary | ICD-10-CM | POA: Diagnosis not present

## 2018-11-12 DIAGNOSIS — F329 Major depressive disorder, single episode, unspecified: Secondary | ICD-10-CM | POA: Diagnosis not present

## 2018-11-12 DIAGNOSIS — F419 Anxiety disorder, unspecified: Secondary | ICD-10-CM | POA: Diagnosis not present

## 2018-11-12 DIAGNOSIS — F988 Other specified behavioral and emotional disorders with onset usually occurring in childhood and adolescence: Secondary | ICD-10-CM | POA: Diagnosis not present

## 2018-11-12 MED FILL — ADDERALL XR 25 MG CAPSULE: 25 | 30 days supply | Qty: 30 | Fill #0

## 2018-11-12 MED FILL — MOMETASONE FUROATE 50 MCG S: 50 | 30 days supply | Qty: 17 | Fill #0

## 2018-11-17 DIAGNOSIS — F4321 Adjustment disorder with depressed mood: Secondary | ICD-10-CM | POA: Diagnosis not present

## 2018-11-24 DIAGNOSIS — F4321 Adjustment disorder with depressed mood: Secondary | ICD-10-CM | POA: Diagnosis not present

## 2018-12-01 DIAGNOSIS — F4321 Adjustment disorder with depressed mood: Secondary | ICD-10-CM | POA: Diagnosis not present

## 2018-12-16 MED FILL — ADDERALL XR 25 MG CAPSULE: 25 | 30 days supply | Qty: 30 | Fill #0

## 2018-12-17 DIAGNOSIS — F4321 Adjustment disorder with depressed mood: Secondary | ICD-10-CM | POA: Diagnosis not present

## 2018-12-18 DIAGNOSIS — Z03818 Encounter for observation for suspected exposure to other biological agents ruled out: Secondary | ICD-10-CM | POA: Diagnosis not present

## 2018-12-29 DIAGNOSIS — F4321 Adjustment disorder with depressed mood: Secondary | ICD-10-CM | POA: Diagnosis not present

## 2018-12-31 MED FILL — PANTOPRAZOLE SOD DR 40 MG T: 40 | 90 days supply | Qty: 90 | Fill #3

## 2018-12-31 MED FILL — LEVOCETIRIZINE 5 MG TABLET: 5 | 90 days supply | Qty: 90 | Fill #3

## 2019-01-05 DIAGNOSIS — F4321 Adjustment disorder with depressed mood: Secondary | ICD-10-CM | POA: Diagnosis not present

## 2019-01-12 DIAGNOSIS — F4321 Adjustment disorder with depressed mood: Secondary | ICD-10-CM | POA: Diagnosis not present

## 2019-01-13 MED FILL — ADDERALL XR 25 MG CAPSULE: 25 | 30 days supply | Qty: 30 | Fill #0

## 2019-01-19 DIAGNOSIS — F4321 Adjustment disorder with depressed mood: Secondary | ICD-10-CM | POA: Diagnosis not present

## 2019-01-27 ENCOUNTER — Ambulatory Visit: Payer: 59 | Attending: Internal Medicine

## 2019-01-27 DIAGNOSIS — Z20828 Contact with and (suspected) exposure to other viral communicable diseases: Secondary | ICD-10-CM | POA: Diagnosis not present

## 2019-01-27 DIAGNOSIS — Z20822 Contact with and (suspected) exposure to covid-19: Secondary | ICD-10-CM

## 2019-01-28 LAB — NOVEL CORONAVIRUS, NAA: SARS-CoV-2, NAA: NOT DETECTED

## 2019-02-05 MED FILL — BUPROPION HCL ER (XL) 300 M: 300 | 30 days supply | Qty: 30 | Fill #2

## 2019-02-11 DIAGNOSIS — F988 Other specified behavioral and emotional disorders with onset usually occurring in childhood and adolescence: Secondary | ICD-10-CM | POA: Diagnosis not present

## 2019-02-11 DIAGNOSIS — J3089 Other allergic rhinitis: Secondary | ICD-10-CM | POA: Diagnosis not present

## 2019-02-11 DIAGNOSIS — K219 Gastro-esophageal reflux disease without esophagitis: Secondary | ICD-10-CM | POA: Diagnosis not present

## 2019-02-11 DIAGNOSIS — F4321 Adjustment disorder with depressed mood: Secondary | ICD-10-CM | POA: Diagnosis not present

## 2019-02-11 MED FILL — ADDERALL XR 25 MG CAPSULE: 25 | 30 days supply | Qty: 30 | Fill #0

## 2019-02-16 DIAGNOSIS — F4321 Adjustment disorder with depressed mood: Secondary | ICD-10-CM | POA: Diagnosis not present

## 2019-02-23 DIAGNOSIS — F4321 Adjustment disorder with depressed mood: Secondary | ICD-10-CM | POA: Diagnosis not present

## 2019-02-26 DIAGNOSIS — H33302 Unspecified retinal break, left eye: Secondary | ICD-10-CM | POA: Diagnosis not present

## 2019-03-02 DIAGNOSIS — F4321 Adjustment disorder with depressed mood: Secondary | ICD-10-CM | POA: Diagnosis not present

## 2019-03-09 DIAGNOSIS — F4321 Adjustment disorder with depressed mood: Secondary | ICD-10-CM | POA: Diagnosis not present

## 2019-03-12 MED FILL — BUPROPION HCL XL 300 MG TAB: 300 | 30 days supply | Qty: 30 | Fill #3

## 2019-03-16 DIAGNOSIS — F4321 Adjustment disorder with depressed mood: Secondary | ICD-10-CM | POA: Diagnosis not present

## 2019-03-23 DIAGNOSIS — F4321 Adjustment disorder with depressed mood: Secondary | ICD-10-CM | POA: Diagnosis not present

## 2019-03-23 MED FILL — ADDERALL XR 25 MG CAPSULE: 25 | 30 days supply | Qty: 30 | Fill #0

## 2019-03-30 DIAGNOSIS — F4321 Adjustment disorder with depressed mood: Secondary | ICD-10-CM | POA: Diagnosis not present

## 2019-04-02 MED FILL — PANTOPRAZOLE SOD DR 40 MG T: 40 | 90 days supply | Qty: 90 | Fill #0

## 2019-04-02 MED FILL — LEVOCETIRIZINE 5 MG TABLET: 5 | 90 days supply | Qty: 90 | Fill #0

## 2019-04-06 DIAGNOSIS — F4321 Adjustment disorder with depressed mood: Secondary | ICD-10-CM | POA: Diagnosis not present

## 2019-04-10 MED FILL — buPROPion HCL ER (XL) 300 M: 300 | 30 days supply | Qty: 30 | Fill #4

## 2019-04-13 DIAGNOSIS — F4321 Adjustment disorder with depressed mood: Secondary | ICD-10-CM | POA: Diagnosis not present

## 2019-04-24 MED FILL — ADDERALL XR 25 MG CAPSULE: 25 | 30 days supply | Qty: 30 | Fill #0

## 2019-04-27 DIAGNOSIS — F4321 Adjustment disorder with depressed mood: Secondary | ICD-10-CM | POA: Diagnosis not present

## 2019-05-10 MED FILL — buPROPion HCL ER (XL) 300 M: 300 | 30 days supply | Qty: 30 | Fill #5

## 2019-05-25 MED FILL — ADDERALL XR 25 MG CAPSULE: 25 | 30 days supply | Qty: 30 | Fill #0

## 2019-05-29 DIAGNOSIS — F988 Other specified behavioral and emotional disorders with onset usually occurring in childhood and adolescence: Secondary | ICD-10-CM | POA: Diagnosis not present

## 2019-05-29 DIAGNOSIS — F329 Major depressive disorder, single episode, unspecified: Secondary | ICD-10-CM | POA: Diagnosis not present

## 2019-05-29 DIAGNOSIS — F419 Anxiety disorder, unspecified: Secondary | ICD-10-CM | POA: Diagnosis not present

## 2019-05-29 DIAGNOSIS — R05 Cough: Secondary | ICD-10-CM | POA: Diagnosis not present

## 2019-05-29 DIAGNOSIS — K219 Gastro-esophageal reflux disease without esophagitis: Secondary | ICD-10-CM | POA: Diagnosis not present

## 2019-06-15 MED FILL — buPROPion HCL ER (XL) 300 M: 300 | 30 days supply | Qty: 30 | Fill #6

## 2019-06-27 MED FILL — ADDERALL XR 25 MG CAPSULE: 25 | 30 days supply | Qty: 30 | Fill #0

## 2019-07-02 MED FILL — PANTOPRAZOLE SOD DR 40 MG T: 40 | 90 days supply | Qty: 90 | Fill #1

## 2019-07-02 MED FILL — LEVOCETIRIZINE 5 MG TABLET: 5 | 90 days supply | Qty: 90 | Fill #1

## 2019-07-20 MED FILL — buPROPion HCL ER (XL) 300 M: 300 | 30 days supply | Qty: 30 | Fill #0

## 2019-07-28 MED FILL — ADDERALL XR 25 MG CAPSULE: 25 | 30 days supply | Qty: 30 | Fill #0

## 2019-08-19 MED FILL — buPROPion HCL ER (XL) 300 M: 300 | 30 days supply | Qty: 30 | Fill #1

## 2019-08-28 ENCOUNTER — Other Ambulatory Visit (HOSPITAL_COMMUNITY): Payer: Self-pay | Admitting: Physician Assistant

## 2019-08-28 DIAGNOSIS — E669 Obesity, unspecified: Secondary | ICD-10-CM | POA: Diagnosis not present

## 2019-08-28 DIAGNOSIS — F329 Major depressive disorder, single episode, unspecified: Secondary | ICD-10-CM | POA: Diagnosis not present

## 2019-08-28 DIAGNOSIS — Z23 Encounter for immunization: Secondary | ICD-10-CM | POA: Diagnosis not present

## 2019-08-28 DIAGNOSIS — F419 Anxiety disorder, unspecified: Secondary | ICD-10-CM | POA: Diagnosis not present

## 2019-08-28 DIAGNOSIS — E785 Hyperlipidemia, unspecified: Secondary | ICD-10-CM | POA: Diagnosis not present

## 2019-08-28 DIAGNOSIS — F988 Other specified behavioral and emotional disorders with onset usually occurring in childhood and adolescence: Secondary | ICD-10-CM | POA: Diagnosis not present

## 2019-08-28 DIAGNOSIS — Z0000 Encounter for general adult medical examination without abnormal findings: Secondary | ICD-10-CM | POA: Diagnosis not present

## 2019-08-28 DIAGNOSIS — E559 Vitamin D deficiency, unspecified: Secondary | ICD-10-CM | POA: Diagnosis not present

## 2019-08-28 MED FILL — ADDERALL XR 25 MG CAPSULE: 25 | 30 days supply | Qty: 30 | Fill #0

## 2019-09-15 DIAGNOSIS — Z20822 Contact with and (suspected) exposure to covid-19: Secondary | ICD-10-CM | POA: Diagnosis not present

## 2019-09-15 DIAGNOSIS — Z03818 Encounter for observation for suspected exposure to other biological agents ruled out: Secondary | ICD-10-CM | POA: Diagnosis not present

## 2019-09-16 ENCOUNTER — Other Ambulatory Visit: Payer: 59

## 2019-09-16 ENCOUNTER — Other Ambulatory Visit: Payer: Self-pay | Admitting: Critical Care Medicine

## 2019-09-16 DIAGNOSIS — Z20822 Contact with and (suspected) exposure to covid-19: Secondary | ICD-10-CM | POA: Diagnosis not present

## 2019-09-18 LAB — SARS-COV-2, NAA 2 DAY TAT

## 2019-09-18 LAB — NOVEL CORONAVIRUS, NAA: SARS-CoV-2, NAA: NOT DETECTED

## 2019-09-28 MED FILL — ADDERALL XR 25 MG CAPSULE: 25 | 30 days supply | Qty: 30 | Fill #0

## 2019-09-28 MED FILL — PANTOPRAZOLE SOD DR 40 MG T: 40 | 90 days supply | Qty: 90 | Fill #2

## 2019-09-28 MED FILL — buPROPion HCL ER (XL) 300 M: 300 | 90 days supply | Qty: 90 | Fill #0

## 2019-09-28 MED FILL — LEVOCETIRIZINE 5 MG TABLET: 5 | 90 days supply | Qty: 90 | Fill #2

## 2019-11-03 MED FILL — ADDERALL XR 25 MG CAPSULE: 25 | 30 days supply | Qty: 30 | Fill #0

## 2019-11-20 ENCOUNTER — Other Ambulatory Visit: Payer: 59

## 2019-11-20 DIAGNOSIS — Z20822 Contact with and (suspected) exposure to covid-19: Secondary | ICD-10-CM

## 2019-11-21 LAB — SARS-COV-2, NAA 2 DAY TAT

## 2019-11-21 LAB — NOVEL CORONAVIRUS, NAA: SARS-CoV-2, NAA: NOT DETECTED

## 2019-11-27 ENCOUNTER — Other Ambulatory Visit (HOSPITAL_COMMUNITY): Payer: Self-pay | Admitting: Physician Assistant

## 2019-11-27 DIAGNOSIS — F988 Other specified behavioral and emotional disorders with onset usually occurring in childhood and adolescence: Secondary | ICD-10-CM | POA: Diagnosis not present

## 2019-12-01 MED FILL — ADDERALL XR 25 MG CAPSULE: 25 | 30 days supply | Qty: 30 | Fill #0

## 2019-12-04 DIAGNOSIS — Z23 Encounter for immunization: Secondary | ICD-10-CM | POA: Diagnosis not present

## 2019-12-29 MED FILL — buPROPion HCL ER (XL) 300 M: 300 | 90 days supply | Qty: 90 | Fill #1

## 2019-12-29 MED FILL — ADDERALL XR 25 MG CAPSULE: 25 | 30 days supply | Qty: 30 | Fill #0

## 2020-01-09 MED FILL — PANTOPRAZOLE SOD DR 40 MG T: 40 | 90 days supply | Qty: 90 | Fill #3

## 2020-01-09 MED FILL — LEVOCETIRIZINE 5 MG TABLET: 5 | 90 days supply | Qty: 90 | Fill #3

## 2020-02-02 DIAGNOSIS — F988 Other specified behavioral and emotional disorders with onset usually occurring in childhood and adolescence: Secondary | ICD-10-CM | POA: Diagnosis not present

## 2020-02-13 MED FILL — ADDERALL XR 25 MG CAPSULE: 25 | 30 days supply | Qty: 30 | Fill #0

## 2020-03-15 ENCOUNTER — Encounter: Payer: Self-pay | Admitting: Internal Medicine

## 2020-03-17 ENCOUNTER — Other Ambulatory Visit (HOSPITAL_COMMUNITY): Payer: Self-pay | Admitting: Physician Assistant

## 2020-03-17 MED FILL — ADDERALL XR 25 MG CAPSULE: 25 | 30 days supply | Qty: 30 | Fill #0

## 2020-04-08 MED FILL — buPROPion HCL ER (XL) 300 M: 300 | 90 days supply | Qty: 90 | Fill #2

## 2020-04-22 ENCOUNTER — Other Ambulatory Visit (HOSPITAL_COMMUNITY): Payer: Self-pay | Admitting: Physician Assistant

## 2020-04-22 MED FILL — LEVOCETIRIZINE 5 MG TABLET: 5 | 90 days supply | Qty: 90 | Fill #0

## 2020-04-22 MED FILL — PANTOPRAZOLE SOD DR 40 MG T: 40 | 90 days supply | Qty: 90 | Fill #0

## 2020-05-02 DIAGNOSIS — F32A Depression, unspecified: Secondary | ICD-10-CM | POA: Diagnosis not present

## 2020-05-02 DIAGNOSIS — F988 Other specified behavioral and emotional disorders with onset usually occurring in childhood and adolescence: Secondary | ICD-10-CM | POA: Diagnosis not present

## 2020-05-02 DIAGNOSIS — F419 Anxiety disorder, unspecified: Secondary | ICD-10-CM | POA: Diagnosis not present

## 2020-05-09 DIAGNOSIS — F4323 Adjustment disorder with mixed anxiety and depressed mood: Secondary | ICD-10-CM | POA: Diagnosis not present

## 2020-05-14 ENCOUNTER — Other Ambulatory Visit (HOSPITAL_COMMUNITY): Payer: Self-pay

## 2020-05-17 ENCOUNTER — Other Ambulatory Visit (HOSPITAL_COMMUNITY): Payer: Self-pay

## 2020-05-17 MED ORDER — ADDERALL XR 25 MG PO CP24
ORAL_CAPSULE | Freq: Every morning | ORAL | 0 refills | Status: DC
  Filled 2020-08-30: qty 30, 30d supply, fill #0

## 2020-05-17 MED ORDER — ADDERALL XR 25 MG PO CP24
ORAL_CAPSULE | ORAL | 0 refills | Status: AC
  Filled 2020-07-27: qty 30, 30d supply, fill #0

## 2020-05-17 MED ORDER — ADDERALL XR 25 MG PO CP24
ORAL_CAPSULE | Freq: Every morning | ORAL | 0 refills | Status: AC
  Filled 2020-05-17: qty 30, 30d supply, fill #0

## 2020-05-18 ENCOUNTER — Other Ambulatory Visit (HOSPITAL_COMMUNITY): Payer: Self-pay

## 2020-05-23 DIAGNOSIS — F4323 Adjustment disorder with mixed anxiety and depressed mood: Secondary | ICD-10-CM | POA: Diagnosis not present

## 2020-06-02 DIAGNOSIS — F4323 Adjustment disorder with mixed anxiety and depressed mood: Secondary | ICD-10-CM | POA: Diagnosis not present

## 2020-06-08 DIAGNOSIS — F4323 Adjustment disorder with mixed anxiety and depressed mood: Secondary | ICD-10-CM | POA: Diagnosis not present

## 2020-06-16 DIAGNOSIS — F4323 Adjustment disorder with mixed anxiety and depressed mood: Secondary | ICD-10-CM | POA: Diagnosis not present

## 2020-06-20 ENCOUNTER — Other Ambulatory Visit (HOSPITAL_COMMUNITY): Payer: Self-pay

## 2020-06-20 MED FILL — Amphetamine-Dextroamphetamine Cap ER 24HR 25 MG: ORAL | 30 days supply | Qty: 30 | Fill #0 | Status: AC

## 2020-06-30 DIAGNOSIS — F4323 Adjustment disorder with mixed anxiety and depressed mood: Secondary | ICD-10-CM | POA: Diagnosis not present

## 2020-07-14 ENCOUNTER — Other Ambulatory Visit (HOSPITAL_COMMUNITY): Payer: Self-pay

## 2020-07-14 MED FILL — Bupropion HCl Tab ER 24HR 300 MG: ORAL | 90 days supply | Qty: 90 | Fill #0 | Status: AC

## 2020-07-27 ENCOUNTER — Other Ambulatory Visit (HOSPITAL_COMMUNITY): Payer: Self-pay

## 2020-07-27 MED FILL — Pantoprazole Sodium EC Tab 40 MG (Base Equiv): ORAL | 90 days supply | Qty: 90 | Fill #0 | Status: AC

## 2020-07-27 MED FILL — Levocetirizine Dihydrochloride Tab 5 MG: ORAL | 90 days supply | Qty: 90 | Fill #0 | Status: AC

## 2020-08-30 ENCOUNTER — Other Ambulatory Visit (HOSPITAL_COMMUNITY): Payer: Self-pay

## 2020-09-03 ENCOUNTER — Other Ambulatory Visit (HOSPITAL_COMMUNITY): Payer: Self-pay

## 2020-09-08 ENCOUNTER — Other Ambulatory Visit (HOSPITAL_COMMUNITY): Payer: Self-pay

## 2020-09-08 DIAGNOSIS — F988 Other specified behavioral and emotional disorders with onset usually occurring in childhood and adolescence: Secondary | ICD-10-CM | POA: Diagnosis not present

## 2020-09-08 DIAGNOSIS — H9190 Unspecified hearing loss, unspecified ear: Secondary | ICD-10-CM | POA: Diagnosis not present

## 2020-09-08 MED ORDER — ADDERALL XR 25 MG PO CP24
ORAL_CAPSULE | ORAL | 0 refills | Status: AC
  Filled 2020-10-05: qty 30, 30d supply, fill #0

## 2020-09-08 MED ORDER — ADDERALL XR 25 MG PO CP24
ORAL_CAPSULE | Freq: Every morning | ORAL | 0 refills | Status: AC
  Filled 2021-01-11: qty 30, 30d supply, fill #0

## 2020-09-08 MED ORDER — ADDERALL XR 25 MG PO CP24
ORAL_CAPSULE | Freq: Every morning | ORAL | 0 refills | Status: AC
  Filled 2020-11-09: qty 30, 30d supply, fill #0

## 2020-09-26 DIAGNOSIS — H9190 Unspecified hearing loss, unspecified ear: Secondary | ICD-10-CM | POA: Diagnosis not present

## 2020-10-05 ENCOUNTER — Other Ambulatory Visit (HOSPITAL_COMMUNITY): Payer: Self-pay

## 2020-10-20 DIAGNOSIS — F4323 Adjustment disorder with mixed anxiety and depressed mood: Secondary | ICD-10-CM | POA: Diagnosis not present

## 2020-11-02 DIAGNOSIS — F4323 Adjustment disorder with mixed anxiety and depressed mood: Secondary | ICD-10-CM | POA: Diagnosis not present

## 2020-11-05 ENCOUNTER — Other Ambulatory Visit (HOSPITAL_COMMUNITY): Payer: Self-pay

## 2020-11-05 MED FILL — Pantoprazole Sodium EC Tab 40 MG (Base Equiv): ORAL | 90 days supply | Qty: 90 | Fill #1 | Status: AC

## 2020-11-05 MED FILL — Levocetirizine Dihydrochloride Tab 5 MG: ORAL | 90 days supply | Qty: 90 | Fill #1 | Status: AC

## 2020-11-09 ENCOUNTER — Other Ambulatory Visit (HOSPITAL_COMMUNITY): Payer: Self-pay

## 2020-11-14 DIAGNOSIS — F4323 Adjustment disorder with mixed anxiety and depressed mood: Secondary | ICD-10-CM | POA: Diagnosis not present

## 2020-11-28 DIAGNOSIS — F4323 Adjustment disorder with mixed anxiety and depressed mood: Secondary | ICD-10-CM | POA: Diagnosis not present

## 2020-12-07 DIAGNOSIS — F4323 Adjustment disorder with mixed anxiety and depressed mood: Secondary | ICD-10-CM | POA: Diagnosis not present

## 2020-12-09 ENCOUNTER — Other Ambulatory Visit (HOSPITAL_COMMUNITY): Payer: Self-pay

## 2020-12-09 DIAGNOSIS — F419 Anxiety disorder, unspecified: Secondary | ICD-10-CM | POA: Diagnosis not present

## 2020-12-09 DIAGNOSIS — F988 Other specified behavioral and emotional disorders with onset usually occurring in childhood and adolescence: Secondary | ICD-10-CM | POA: Diagnosis not present

## 2020-12-09 DIAGNOSIS — K219 Gastro-esophageal reflux disease without esophagitis: Secondary | ICD-10-CM | POA: Diagnosis not present

## 2020-12-09 DIAGNOSIS — E785 Hyperlipidemia, unspecified: Secondary | ICD-10-CM | POA: Diagnosis not present

## 2020-12-09 DIAGNOSIS — Z1211 Encounter for screening for malignant neoplasm of colon: Secondary | ICD-10-CM | POA: Diagnosis not present

## 2020-12-09 DIAGNOSIS — Z0000 Encounter for general adult medical examination without abnormal findings: Secondary | ICD-10-CM | POA: Diagnosis not present

## 2020-12-09 DIAGNOSIS — D649 Anemia, unspecified: Secondary | ICD-10-CM | POA: Diagnosis not present

## 2020-12-09 DIAGNOSIS — E669 Obesity, unspecified: Secondary | ICD-10-CM | POA: Diagnosis not present

## 2020-12-09 DIAGNOSIS — Z23 Encounter for immunization: Secondary | ICD-10-CM | POA: Diagnosis not present

## 2020-12-09 MED ORDER — ADDERALL XR 25 MG PO CP24
ORAL_CAPSULE | Freq: Every morning | ORAL | 0 refills | Status: AC
  Filled 2020-12-09: qty 30, 30d supply, fill #0

## 2020-12-09 MED ORDER — ADDERALL XR 25 MG PO CP24: ORAL_CAPSULE | ORAL | 0 refills | Status: AC

## 2020-12-09 MED ORDER — ADDERALL XR 25 MG PO CP24
ORAL_CAPSULE | Freq: Every morning | ORAL | 0 refills | Status: AC
  Filled 2021-02-13: qty 30, 30d supply, fill #0

## 2020-12-12 ENCOUNTER — Other Ambulatory Visit (HOSPITAL_COMMUNITY): Payer: Self-pay

## 2020-12-12 DIAGNOSIS — F4323 Adjustment disorder with mixed anxiety and depressed mood: Secondary | ICD-10-CM | POA: Diagnosis not present

## 2020-12-15 ENCOUNTER — Other Ambulatory Visit (HOSPITAL_COMMUNITY): Payer: Self-pay

## 2020-12-15 MED ORDER — BUPROPION HCL ER (XL) 300 MG PO TB24
300.0000 mg | ORAL_TABLET | Freq: Every morning | ORAL | 3 refills | Status: DC
  Filled 2020-12-15: qty 90, 90d supply, fill #0
  Filled 2021-03-21: qty 90, 90d supply, fill #1
  Filled 2021-06-24: qty 90, 90d supply, fill #2
  Filled 2021-09-25: qty 90, 90d supply, fill #3

## 2020-12-16 ENCOUNTER — Other Ambulatory Visit (HOSPITAL_COMMUNITY): Payer: Self-pay

## 2020-12-26 DIAGNOSIS — F4323 Adjustment disorder with mixed anxiety and depressed mood: Secondary | ICD-10-CM | POA: Diagnosis not present

## 2021-01-02 DIAGNOSIS — F4323 Adjustment disorder with mixed anxiety and depressed mood: Secondary | ICD-10-CM | POA: Diagnosis not present

## 2021-01-11 ENCOUNTER — Other Ambulatory Visit (HOSPITAL_COMMUNITY): Payer: Self-pay

## 2021-01-16 DIAGNOSIS — F4323 Adjustment disorder with mixed anxiety and depressed mood: Secondary | ICD-10-CM | POA: Diagnosis not present

## 2021-02-02 DIAGNOSIS — F4323 Adjustment disorder with mixed anxiety and depressed mood: Secondary | ICD-10-CM | POA: Diagnosis not present

## 2021-02-06 ENCOUNTER — Other Ambulatory Visit (HOSPITAL_COMMUNITY): Payer: Self-pay

## 2021-02-06 MED FILL — Levocetirizine Dihydrochloride Tab 5 MG: ORAL | 90 days supply | Qty: 90 | Fill #2 | Status: AC

## 2021-02-06 MED FILL — Pantoprazole Sodium EC Tab 40 MG (Base Equiv): ORAL | 90 days supply | Qty: 90 | Fill #2 | Status: AC

## 2021-02-13 ENCOUNTER — Other Ambulatory Visit (HOSPITAL_COMMUNITY): Payer: Self-pay

## 2021-02-13 DIAGNOSIS — F4323 Adjustment disorder with mixed anxiety and depressed mood: Secondary | ICD-10-CM | POA: Diagnosis not present

## 2021-02-13 DIAGNOSIS — Z1231 Encounter for screening mammogram for malignant neoplasm of breast: Secondary | ICD-10-CM | POA: Diagnosis not present

## 2021-02-21 DIAGNOSIS — F4323 Adjustment disorder with mixed anxiety and depressed mood: Secondary | ICD-10-CM | POA: Diagnosis not present

## 2021-02-28 DIAGNOSIS — H903 Sensorineural hearing loss, bilateral: Secondary | ICD-10-CM | POA: Diagnosis not present

## 2021-03-06 DIAGNOSIS — F4323 Adjustment disorder with mixed anxiety and depressed mood: Secondary | ICD-10-CM | POA: Diagnosis not present

## 2021-03-15 ENCOUNTER — Other Ambulatory Visit (HOSPITAL_COMMUNITY): Payer: Self-pay

## 2021-03-15 DIAGNOSIS — F988 Other specified behavioral and emotional disorders with onset usually occurring in childhood and adolescence: Secondary | ICD-10-CM | POA: Diagnosis not present

## 2021-03-15 DIAGNOSIS — K219 Gastro-esophageal reflux disease without esophagitis: Secondary | ICD-10-CM | POA: Diagnosis not present

## 2021-03-15 DIAGNOSIS — J3089 Other allergic rhinitis: Secondary | ICD-10-CM | POA: Diagnosis not present

## 2021-03-15 MED ORDER — ADDERALL XR 25 MG PO CP24
ORAL_CAPSULE | Freq: Every morning | ORAL | 0 refills | Status: AC
  Filled 2021-03-15: qty 30, 30d supply, fill #0

## 2021-03-15 MED ORDER — LEVOCETIRIZINE DIHYDROCHLORIDE 5 MG PO TABS
5.0000 mg | ORAL_TABLET | Freq: Every day | ORAL | 3 refills | Status: AC
  Filled 2021-03-15 – 2021-05-12 (×2): qty 90, 90d supply, fill #0
  Filled 2021-07-25: qty 90, 90d supply, fill #1
  Filled 2021-11-07: qty 90, 90d supply, fill #2
  Filled 2022-02-06 – 2022-02-17 (×2): qty 90, 90d supply, fill #3

## 2021-03-15 MED ORDER — ADDERALL XR 25 MG PO CP24
ORAL_CAPSULE | Freq: Every morning | ORAL | 0 refills | Status: DC
  Filled 2021-05-16: qty 30, 30d supply, fill #0

## 2021-03-15 MED ORDER — PANTOPRAZOLE SODIUM 40 MG PO TBEC
40.0000 mg | DELAYED_RELEASE_TABLET | Freq: Every day | ORAL | 3 refills | Status: DC
  Filled 2021-05-12: qty 90, 90d supply, fill #0
  Filled 2021-08-19: qty 90, 90d supply, fill #1
  Filled 2021-11-24: qty 90, 90d supply, fill #2

## 2021-03-15 MED ORDER — ADDERALL XR 25 MG PO CP24
ORAL_CAPSULE | ORAL | 0 refills | Status: AC
  Filled 2021-04-19 (×2): qty 30, 30d supply, fill #0

## 2021-03-21 ENCOUNTER — Other Ambulatory Visit (HOSPITAL_COMMUNITY): Payer: Self-pay

## 2021-04-19 ENCOUNTER — Other Ambulatory Visit (HOSPITAL_COMMUNITY): Payer: Self-pay

## 2021-04-26 DIAGNOSIS — H903 Sensorineural hearing loss, bilateral: Secondary | ICD-10-CM | POA: Diagnosis not present

## 2021-05-12 ENCOUNTER — Other Ambulatory Visit (HOSPITAL_COMMUNITY): Payer: Self-pay

## 2021-05-16 ENCOUNTER — Other Ambulatory Visit (HOSPITAL_COMMUNITY): Payer: Self-pay

## 2021-06-08 ENCOUNTER — Other Ambulatory Visit (HOSPITAL_BASED_OUTPATIENT_CLINIC_OR_DEPARTMENT_OTHER): Payer: Self-pay

## 2021-06-08 MED ORDER — PAXLOVID (300/100) 20 X 150 MG & 10 X 100MG PO TBPK
ORAL_TABLET | ORAL | 0 refills | Status: AC
  Filled 2021-06-08: qty 30, 5d supply, fill #0

## 2021-06-14 ENCOUNTER — Other Ambulatory Visit (HOSPITAL_BASED_OUTPATIENT_CLINIC_OR_DEPARTMENT_OTHER): Payer: Self-pay

## 2021-06-14 ENCOUNTER — Other Ambulatory Visit (HOSPITAL_COMMUNITY): Payer: Self-pay

## 2021-06-14 DIAGNOSIS — F988 Other specified behavioral and emotional disorders with onset usually occurring in childhood and adolescence: Secondary | ICD-10-CM | POA: Diagnosis not present

## 2021-06-14 MED ORDER — ADDERALL XR 25 MG PO CP24
ORAL_CAPSULE | ORAL | 0 refills | Status: AC
  Filled 2021-07-24: qty 30, 30d supply, fill #0

## 2021-06-14 MED ORDER — ADDERALL XR 25 MG PO CP24
ORAL_CAPSULE | Freq: Every morning | ORAL | 0 refills | Status: AC
Start: 2021-06-14 — End: ?
  Filled 2021-06-24: qty 30, 30d supply, fill #0

## 2021-06-14 MED ORDER — ADDERALL XR 25 MG PO CP24
ORAL_CAPSULE | Freq: Every morning | ORAL | 0 refills | Status: AC
  Filled 2021-08-19: qty 30, fill #0
  Filled 2021-08-21: qty 30, 30d supply, fill #0

## 2021-06-24 ENCOUNTER — Other Ambulatory Visit (HOSPITAL_COMMUNITY): Payer: Self-pay

## 2021-07-13 DIAGNOSIS — H903 Sensorineural hearing loss, bilateral: Secondary | ICD-10-CM | POA: Diagnosis not present

## 2021-07-24 ENCOUNTER — Other Ambulatory Visit (HOSPITAL_COMMUNITY): Payer: Self-pay

## 2021-07-25 ENCOUNTER — Other Ambulatory Visit (HOSPITAL_COMMUNITY): Payer: Self-pay

## 2021-08-07 DIAGNOSIS — F4323 Adjustment disorder with mixed anxiety and depressed mood: Secondary | ICD-10-CM | POA: Diagnosis not present

## 2021-08-17 DIAGNOSIS — F4323 Adjustment disorder with mixed anxiety and depressed mood: Secondary | ICD-10-CM | POA: Diagnosis not present

## 2021-08-19 ENCOUNTER — Other Ambulatory Visit (HOSPITAL_COMMUNITY): Payer: Self-pay

## 2021-08-21 ENCOUNTER — Other Ambulatory Visit (HOSPITAL_COMMUNITY): Payer: Self-pay

## 2021-08-25 DIAGNOSIS — F4323 Adjustment disorder with mixed anxiety and depressed mood: Secondary | ICD-10-CM | POA: Diagnosis not present

## 2021-09-11 ENCOUNTER — Other Ambulatory Visit (HOSPITAL_COMMUNITY): Payer: Self-pay

## 2021-09-12 DIAGNOSIS — F4323 Adjustment disorder with mixed anxiety and depressed mood: Secondary | ICD-10-CM | POA: Diagnosis not present

## 2021-09-13 ENCOUNTER — Other Ambulatory Visit (HOSPITAL_COMMUNITY): Payer: Self-pay

## 2021-09-13 DIAGNOSIS — F988 Other specified behavioral and emotional disorders with onset usually occurring in childhood and adolescence: Secondary | ICD-10-CM | POA: Diagnosis not present

## 2021-09-13 MED ORDER — ADDERALL XR 25 MG PO CP24
ORAL_CAPSULE | Freq: Every morning | ORAL | 0 refills | Status: AC
Start: 2021-09-13 — End: ?
  Filled 2021-09-20: qty 30, fill #0
  Filled 2021-09-21: qty 30, 30d supply, fill #0

## 2021-09-13 MED ORDER — ADDERALL XR 25 MG PO CP24
ORAL_CAPSULE | ORAL | 0 refills | Status: AC
  Filled 2021-10-26: qty 30, 30d supply, fill #0

## 2021-09-13 MED ORDER — ADDERALL XR 25 MG PO CP24
25.0000 mg | ORAL_CAPSULE | Freq: Every morning | ORAL | 0 refills | Status: DC
  Filled 2021-11-24: qty 30, 30d supply, fill #0

## 2021-09-20 ENCOUNTER — Other Ambulatory Visit (HOSPITAL_COMMUNITY): Payer: Self-pay

## 2021-09-21 ENCOUNTER — Other Ambulatory Visit (HOSPITAL_COMMUNITY): Payer: Self-pay

## 2021-09-25 ENCOUNTER — Other Ambulatory Visit (HOSPITAL_COMMUNITY): Payer: Self-pay

## 2021-09-25 DIAGNOSIS — F4323 Adjustment disorder with mixed anxiety and depressed mood: Secondary | ICD-10-CM | POA: Diagnosis not present

## 2021-10-05 DIAGNOSIS — F4323 Adjustment disorder with mixed anxiety and depressed mood: Secondary | ICD-10-CM | POA: Diagnosis not present

## 2021-10-16 DIAGNOSIS — F4323 Adjustment disorder with mixed anxiety and depressed mood: Secondary | ICD-10-CM | POA: Diagnosis not present

## 2021-10-26 ENCOUNTER — Other Ambulatory Visit (HOSPITAL_COMMUNITY): Payer: Self-pay

## 2021-10-30 DIAGNOSIS — F4323 Adjustment disorder with mixed anxiety and depressed mood: Secondary | ICD-10-CM | POA: Diagnosis not present

## 2021-11-07 ENCOUNTER — Other Ambulatory Visit (HOSPITAL_COMMUNITY): Payer: Self-pay

## 2021-11-14 DIAGNOSIS — F4323 Adjustment disorder with mixed anxiety and depressed mood: Secondary | ICD-10-CM | POA: Diagnosis not present

## 2021-11-20 DIAGNOSIS — F4323 Adjustment disorder with mixed anxiety and depressed mood: Secondary | ICD-10-CM | POA: Diagnosis not present

## 2021-11-24 ENCOUNTER — Other Ambulatory Visit (HOSPITAL_COMMUNITY): Payer: Self-pay

## 2021-11-30 DIAGNOSIS — F4323 Adjustment disorder with mixed anxiety and depressed mood: Secondary | ICD-10-CM | POA: Diagnosis not present

## 2021-12-06 DIAGNOSIS — F4323 Adjustment disorder with mixed anxiety and depressed mood: Secondary | ICD-10-CM | POA: Diagnosis not present

## 2021-12-13 DIAGNOSIS — F4323 Adjustment disorder with mixed anxiety and depressed mood: Secondary | ICD-10-CM | POA: Diagnosis not present

## 2021-12-18 ENCOUNTER — Other Ambulatory Visit (HOSPITAL_COMMUNITY): Payer: Self-pay

## 2021-12-18 DIAGNOSIS — Z0000 Encounter for general adult medical examination without abnormal findings: Secondary | ICD-10-CM | POA: Diagnosis not present

## 2021-12-18 DIAGNOSIS — Z23 Encounter for immunization: Secondary | ICD-10-CM | POA: Diagnosis not present

## 2021-12-18 DIAGNOSIS — F419 Anxiety disorder, unspecified: Secondary | ICD-10-CM | POA: Diagnosis not present

## 2021-12-18 DIAGNOSIS — D649 Anemia, unspecified: Secondary | ICD-10-CM | POA: Diagnosis not present

## 2021-12-18 DIAGNOSIS — F32A Depression, unspecified: Secondary | ICD-10-CM | POA: Diagnosis not present

## 2021-12-18 DIAGNOSIS — F988 Other specified behavioral and emotional disorders with onset usually occurring in childhood and adolescence: Secondary | ICD-10-CM | POA: Diagnosis not present

## 2021-12-18 DIAGNOSIS — E785 Hyperlipidemia, unspecified: Secondary | ICD-10-CM | POA: Diagnosis not present

## 2021-12-18 DIAGNOSIS — E669 Obesity, unspecified: Secondary | ICD-10-CM | POA: Diagnosis not present

## 2021-12-18 MED ORDER — ADDERALL XR 25 MG PO CP24
25.0000 mg | ORAL_CAPSULE | Freq: Every morning | ORAL | 0 refills | Status: DC
  Filled 2022-02-16 – 2022-05-23 (×2): qty 30, 30d supply, fill #0

## 2021-12-18 MED ORDER — AMPHETAMINE-DEXTROAMPHET ER 25 MG PO CP24
25.0000 mg | ORAL_CAPSULE | Freq: Every morning | ORAL | 0 refills | Status: DC
  Filled 2022-02-06 – 2022-03-19 (×3): qty 30, 30d supply, fill #0

## 2021-12-18 MED ORDER — ADDERALL XR 25 MG PO CP24
25.0000 mg | ORAL_CAPSULE | Freq: Every morning | ORAL | 0 refills | Status: AC
  Filled 2021-12-18 (×2): qty 30, 30d supply, fill #0

## 2021-12-18 MED ORDER — BUPROPION HCL ER (XL) 300 MG PO TB24
300.0000 mg | ORAL_TABLET | Freq: Every morning | ORAL | 3 refills | Status: DC
  Filled 2021-12-18: qty 90, 90d supply, fill #0
  Filled 2022-04-04: qty 90, 90d supply, fill #1
  Filled 2022-07-10: qty 90, 90d supply, fill #2
  Filled 2022-10-18: qty 90, 90d supply, fill #3

## 2021-12-27 DIAGNOSIS — F4323 Adjustment disorder with mixed anxiety and depressed mood: Secondary | ICD-10-CM | POA: Diagnosis not present

## 2022-01-02 ENCOUNTER — Other Ambulatory Visit (HOSPITAL_COMMUNITY): Payer: Self-pay

## 2022-01-03 DIAGNOSIS — F4323 Adjustment disorder with mixed anxiety and depressed mood: Secondary | ICD-10-CM | POA: Diagnosis not present

## 2022-01-05 ENCOUNTER — Other Ambulatory Visit (HOSPITAL_COMMUNITY): Payer: Self-pay

## 2022-01-10 DIAGNOSIS — F4323 Adjustment disorder with mixed anxiety and depressed mood: Secondary | ICD-10-CM | POA: Diagnosis not present

## 2022-01-17 DIAGNOSIS — F4323 Adjustment disorder with mixed anxiety and depressed mood: Secondary | ICD-10-CM | POA: Diagnosis not present

## 2022-01-31 DIAGNOSIS — F4323 Adjustment disorder with mixed anxiety and depressed mood: Secondary | ICD-10-CM | POA: Diagnosis not present

## 2022-02-06 ENCOUNTER — Other Ambulatory Visit (HOSPITAL_COMMUNITY): Payer: Self-pay

## 2022-02-07 ENCOUNTER — Other Ambulatory Visit (HOSPITAL_COMMUNITY): Payer: Self-pay

## 2022-02-07 DIAGNOSIS — F4323 Adjustment disorder with mixed anxiety and depressed mood: Secondary | ICD-10-CM | POA: Diagnosis not present

## 2022-02-09 ENCOUNTER — Other Ambulatory Visit (HOSPITAL_COMMUNITY): Payer: Self-pay

## 2022-02-12 DIAGNOSIS — F4323 Adjustment disorder with mixed anxiety and depressed mood: Secondary | ICD-10-CM | POA: Diagnosis not present

## 2022-02-13 ENCOUNTER — Other Ambulatory Visit (HOSPITAL_COMMUNITY): Payer: Self-pay

## 2022-02-15 ENCOUNTER — Other Ambulatory Visit (HOSPITAL_COMMUNITY): Payer: Self-pay

## 2022-02-16 ENCOUNTER — Other Ambulatory Visit (HOSPITAL_COMMUNITY): Payer: Self-pay

## 2022-02-16 MED ORDER — AMPHETAMINE-DEXTROAMPHET ER 25 MG PO CP24
25.0000 mg | ORAL_CAPSULE | Freq: Every morning | ORAL | 0 refills | Status: AC
  Filled 2022-02-16: qty 30, 30d supply, fill #0

## 2022-02-17 ENCOUNTER — Other Ambulatory Visit (HOSPITAL_COMMUNITY): Payer: Self-pay

## 2022-02-19 DIAGNOSIS — Z1231 Encounter for screening mammogram for malignant neoplasm of breast: Secondary | ICD-10-CM | POA: Diagnosis not present

## 2022-02-21 DIAGNOSIS — F4323 Adjustment disorder with mixed anxiety and depressed mood: Secondary | ICD-10-CM | POA: Diagnosis not present

## 2022-02-28 DIAGNOSIS — F4323 Adjustment disorder with mixed anxiety and depressed mood: Secondary | ICD-10-CM | POA: Diagnosis not present

## 2022-03-07 DIAGNOSIS — F4323 Adjustment disorder with mixed anxiety and depressed mood: Secondary | ICD-10-CM | POA: Diagnosis not present

## 2022-03-14 DIAGNOSIS — F4323 Adjustment disorder with mixed anxiety and depressed mood: Secondary | ICD-10-CM | POA: Diagnosis not present

## 2022-03-16 ENCOUNTER — Other Ambulatory Visit (HOSPITAL_COMMUNITY): Payer: Self-pay

## 2022-03-19 ENCOUNTER — Other Ambulatory Visit (HOSPITAL_COMMUNITY): Payer: Self-pay

## 2022-03-20 ENCOUNTER — Other Ambulatory Visit (HOSPITAL_COMMUNITY): Payer: Self-pay

## 2022-03-20 MED ORDER — PANTOPRAZOLE SODIUM 40 MG PO TBEC
40.0000 mg | DELAYED_RELEASE_TABLET | Freq: Every day | ORAL | 3 refills | Status: AC
  Filled 2022-03-20: qty 90, 90d supply, fill #0
  Filled 2022-06-21 – 2022-06-29 (×2): qty 90, 90d supply, fill #1
  Filled 2022-11-13: qty 90, 90d supply, fill #2

## 2022-03-21 ENCOUNTER — Other Ambulatory Visit (HOSPITAL_COMMUNITY): Payer: Self-pay

## 2022-03-21 DIAGNOSIS — F4323 Adjustment disorder with mixed anxiety and depressed mood: Secondary | ICD-10-CM | POA: Diagnosis not present

## 2022-03-28 DIAGNOSIS — F4323 Adjustment disorder with mixed anxiety and depressed mood: Secondary | ICD-10-CM | POA: Diagnosis not present

## 2022-04-04 DIAGNOSIS — F4323 Adjustment disorder with mixed anxiety and depressed mood: Secondary | ICD-10-CM | POA: Diagnosis not present

## 2022-04-05 ENCOUNTER — Other Ambulatory Visit (HOSPITAL_COMMUNITY): Payer: Self-pay

## 2022-04-18 DIAGNOSIS — F4323 Adjustment disorder with mixed anxiety and depressed mood: Secondary | ICD-10-CM | POA: Diagnosis not present

## 2022-04-19 ENCOUNTER — Other Ambulatory Visit (HOSPITAL_COMMUNITY): Payer: Self-pay

## 2022-04-21 ENCOUNTER — Other Ambulatory Visit (HOSPITAL_COMMUNITY): Payer: Self-pay

## 2022-04-21 MED ORDER — AMPHETAMINE-DEXTROAMPHET ER 25 MG PO CP24
25.0000 mg | ORAL_CAPSULE | Freq: Every morning | ORAL | 0 refills | Status: DC
  Filled 2022-04-21: qty 30, 30d supply, fill #0

## 2022-05-03 DIAGNOSIS — F4323 Adjustment disorder with mixed anxiety and depressed mood: Secondary | ICD-10-CM | POA: Diagnosis not present

## 2022-05-07 DIAGNOSIS — F4323 Adjustment disorder with mixed anxiety and depressed mood: Secondary | ICD-10-CM | POA: Diagnosis not present

## 2022-05-16 DIAGNOSIS — F4323 Adjustment disorder with mixed anxiety and depressed mood: Secondary | ICD-10-CM | POA: Diagnosis not present

## 2022-05-22 ENCOUNTER — Other Ambulatory Visit (HOSPITAL_COMMUNITY): Payer: Self-pay

## 2022-05-23 ENCOUNTER — Other Ambulatory Visit (HOSPITAL_COMMUNITY): Payer: Self-pay

## 2022-05-24 DIAGNOSIS — F4323 Adjustment disorder with mixed anxiety and depressed mood: Secondary | ICD-10-CM | POA: Diagnosis not present

## 2022-05-26 ENCOUNTER — Other Ambulatory Visit (HOSPITAL_COMMUNITY): Payer: Self-pay

## 2022-05-28 ENCOUNTER — Other Ambulatory Visit (HOSPITAL_COMMUNITY): Payer: Self-pay

## 2022-05-28 MED ORDER — AMPHETAMINE-DEXTROAMPHET ER 25 MG PO CP24
25.0000 mg | ORAL_CAPSULE | Freq: Every morning | ORAL | 0 refills | Status: DC
  Filled 2022-05-28: qty 30, 30d supply, fill #0

## 2022-05-29 ENCOUNTER — Other Ambulatory Visit (HOSPITAL_COMMUNITY): Payer: Self-pay

## 2022-05-29 MED ORDER — LEVOCETIRIZINE DIHYDROCHLORIDE 5 MG PO TABS
5.0000 mg | ORAL_TABLET | Freq: Every day | ORAL | 3 refills | Status: DC
  Filled 2022-05-29: qty 90, 90d supply, fill #0
  Filled 2022-09-05: qty 90, 90d supply, fill #1
  Filled 2022-12-17: qty 90, 90d supply, fill #2
  Filled 2023-03-30 – 2023-04-16 (×2): qty 90, 90d supply, fill #3

## 2022-05-29 MED ORDER — MOMETASONE FUROATE 50 MCG/ACT NA SUSP
2.0000 | Freq: Every day | NASAL | 12 refills | Status: AC
  Filled 2022-05-29: qty 17, 60d supply, fill #0
  Filled 2022-06-21: qty 17, 30d supply, fill #0

## 2022-05-30 DIAGNOSIS — F4323 Adjustment disorder with mixed anxiety and depressed mood: Secondary | ICD-10-CM | POA: Diagnosis not present

## 2022-06-07 DIAGNOSIS — F4323 Adjustment disorder with mixed anxiety and depressed mood: Secondary | ICD-10-CM | POA: Diagnosis not present

## 2022-06-14 DIAGNOSIS — F4323 Adjustment disorder with mixed anxiety and depressed mood: Secondary | ICD-10-CM | POA: Diagnosis not present

## 2022-06-18 ENCOUNTER — Other Ambulatory Visit (HOSPITAL_COMMUNITY): Payer: Self-pay

## 2022-06-18 DIAGNOSIS — F988 Other specified behavioral and emotional disorders with onset usually occurring in childhood and adolescence: Secondary | ICD-10-CM | POA: Diagnosis not present

## 2022-06-18 MED ORDER — AMPHETAMINE-DEXTROAMPHET ER 25 MG PO CP24
25.0000 mg | ORAL_CAPSULE | Freq: Every morning | ORAL | 0 refills | Status: AC
  Filled 2022-06-26: qty 30, 30d supply, fill #0

## 2022-06-18 MED ORDER — AMPHETAMINE-DEXTROAMPHET ER 25 MG PO CP24
25.0000 mg | ORAL_CAPSULE | Freq: Every morning | ORAL | 0 refills | Status: DC
  Filled 2022-09-05 – 2022-10-18 (×3): qty 30, 30d supply, fill #0

## 2022-06-18 MED ORDER — AMPHETAMINE-DEXTROAMPHET ER 25 MG PO CP24
25.0000 mg | ORAL_CAPSULE | Freq: Every morning | ORAL | 0 refills | Status: AC
  Filled 2022-08-03: qty 30, 30d supply, fill #0

## 2022-06-21 ENCOUNTER — Other Ambulatory Visit (HOSPITAL_COMMUNITY): Payer: Self-pay

## 2022-06-21 DIAGNOSIS — F4323 Adjustment disorder with mixed anxiety and depressed mood: Secondary | ICD-10-CM | POA: Diagnosis not present

## 2022-06-26 ENCOUNTER — Other Ambulatory Visit (HOSPITAL_COMMUNITY): Payer: Self-pay

## 2022-06-28 ENCOUNTER — Other Ambulatory Visit (HOSPITAL_COMMUNITY): Payer: Self-pay

## 2022-06-28 DIAGNOSIS — F4323 Adjustment disorder with mixed anxiety and depressed mood: Secondary | ICD-10-CM | POA: Diagnosis not present

## 2022-06-29 ENCOUNTER — Other Ambulatory Visit (HOSPITAL_COMMUNITY): Payer: Self-pay

## 2022-07-05 DIAGNOSIS — F4323 Adjustment disorder with mixed anxiety and depressed mood: Secondary | ICD-10-CM | POA: Diagnosis not present

## 2022-07-10 ENCOUNTER — Other Ambulatory Visit: Payer: Self-pay

## 2022-07-19 DIAGNOSIS — F4323 Adjustment disorder with mixed anxiety and depressed mood: Secondary | ICD-10-CM | POA: Diagnosis not present

## 2022-08-03 ENCOUNTER — Other Ambulatory Visit (HOSPITAL_COMMUNITY): Payer: Self-pay

## 2022-08-09 DIAGNOSIS — F4323 Adjustment disorder with mixed anxiety and depressed mood: Secondary | ICD-10-CM | POA: Diagnosis not present

## 2022-08-16 DIAGNOSIS — F4323 Adjustment disorder with mixed anxiety and depressed mood: Secondary | ICD-10-CM | POA: Diagnosis not present

## 2022-08-23 DIAGNOSIS — F4323 Adjustment disorder with mixed anxiety and depressed mood: Secondary | ICD-10-CM | POA: Diagnosis not present

## 2022-08-30 DIAGNOSIS — F4323 Adjustment disorder with mixed anxiety and depressed mood: Secondary | ICD-10-CM | POA: Diagnosis not present

## 2022-09-05 ENCOUNTER — Other Ambulatory Visit (HOSPITAL_COMMUNITY): Payer: Self-pay

## 2022-09-08 ENCOUNTER — Other Ambulatory Visit (HOSPITAL_COMMUNITY): Payer: Self-pay

## 2022-09-10 ENCOUNTER — Other Ambulatory Visit (HOSPITAL_COMMUNITY): Payer: Self-pay

## 2022-09-11 ENCOUNTER — Other Ambulatory Visit: Payer: Self-pay

## 2022-09-11 ENCOUNTER — Other Ambulatory Visit (HOSPITAL_COMMUNITY): Payer: Self-pay

## 2022-09-11 MED ORDER — AMPHETAMINE-DEXTROAMPHET ER 30 MG PO CP24
30.0000 mg | ORAL_CAPSULE | Freq: Every morning | ORAL | 0 refills | Status: DC
  Filled 2022-09-11: qty 30, 30d supply, fill #0

## 2022-09-12 DIAGNOSIS — F4323 Adjustment disorder with mixed anxiety and depressed mood: Secondary | ICD-10-CM | POA: Diagnosis not present

## 2022-09-20 DIAGNOSIS — F4323 Adjustment disorder with mixed anxiety and depressed mood: Secondary | ICD-10-CM | POA: Diagnosis not present

## 2022-09-27 DIAGNOSIS — F4323 Adjustment disorder with mixed anxiety and depressed mood: Secondary | ICD-10-CM | POA: Diagnosis not present

## 2022-10-04 DIAGNOSIS — F4323 Adjustment disorder with mixed anxiety and depressed mood: Secondary | ICD-10-CM | POA: Diagnosis not present

## 2022-10-18 ENCOUNTER — Other Ambulatory Visit (HOSPITAL_COMMUNITY): Payer: Self-pay

## 2022-10-18 DIAGNOSIS — F4323 Adjustment disorder with mixed anxiety and depressed mood: Secondary | ICD-10-CM | POA: Diagnosis not present

## 2022-10-25 DIAGNOSIS — F4323 Adjustment disorder with mixed anxiety and depressed mood: Secondary | ICD-10-CM | POA: Diagnosis not present

## 2022-11-01 DIAGNOSIS — F4323 Adjustment disorder with mixed anxiety and depressed mood: Secondary | ICD-10-CM | POA: Diagnosis not present

## 2022-11-08 DIAGNOSIS — F4323 Adjustment disorder with mixed anxiety and depressed mood: Secondary | ICD-10-CM | POA: Diagnosis not present

## 2022-11-15 DIAGNOSIS — F4323 Adjustment disorder with mixed anxiety and depressed mood: Secondary | ICD-10-CM | POA: Diagnosis not present

## 2022-11-16 ENCOUNTER — Other Ambulatory Visit (HOSPITAL_COMMUNITY): Payer: Self-pay

## 2022-11-19 ENCOUNTER — Other Ambulatory Visit (HOSPITAL_COMMUNITY): Payer: Self-pay

## 2022-11-19 MED ORDER — AMPHETAMINE-DEXTROAMPHET ER 25 MG PO CP24
25.0000 mg | ORAL_CAPSULE | Freq: Every morning | ORAL | 0 refills | Status: DC
  Filled 2022-11-19: qty 30, 30d supply, fill #0

## 2022-11-21 ENCOUNTER — Other Ambulatory Visit (HOSPITAL_COMMUNITY): Payer: Self-pay

## 2022-11-21 DIAGNOSIS — F4323 Adjustment disorder with mixed anxiety and depressed mood: Secondary | ICD-10-CM | POA: Diagnosis not present

## 2022-11-29 DIAGNOSIS — F4323 Adjustment disorder with mixed anxiety and depressed mood: Secondary | ICD-10-CM | POA: Diagnosis not present

## 2022-12-06 DIAGNOSIS — F4323 Adjustment disorder with mixed anxiety and depressed mood: Secondary | ICD-10-CM | POA: Diagnosis not present

## 2022-12-13 DIAGNOSIS — F4323 Adjustment disorder with mixed anxiety and depressed mood: Secondary | ICD-10-CM | POA: Diagnosis not present

## 2022-12-19 ENCOUNTER — Other Ambulatory Visit (HOSPITAL_COMMUNITY): Payer: Self-pay

## 2022-12-20 DIAGNOSIS — F4323 Adjustment disorder with mixed anxiety and depressed mood: Secondary | ICD-10-CM | POA: Diagnosis not present

## 2022-12-31 ENCOUNTER — Other Ambulatory Visit (HOSPITAL_COMMUNITY): Payer: Self-pay

## 2023-01-02 ENCOUNTER — Other Ambulatory Visit (HOSPITAL_COMMUNITY): Payer: Self-pay

## 2023-01-02 DIAGNOSIS — Z01419 Encounter for gynecological examination (general) (routine) without abnormal findings: Secondary | ICD-10-CM | POA: Diagnosis not present

## 2023-01-02 DIAGNOSIS — Z6836 Body mass index (BMI) 36.0-36.9, adult: Secondary | ICD-10-CM | POA: Diagnosis not present

## 2023-01-02 DIAGNOSIS — Z124 Encounter for screening for malignant neoplasm of cervix: Secondary | ICD-10-CM | POA: Diagnosis not present

## 2023-01-02 DIAGNOSIS — Z23 Encounter for immunization: Secondary | ICD-10-CM | POA: Diagnosis not present

## 2023-01-02 DIAGNOSIS — Z1231 Encounter for screening mammogram for malignant neoplasm of breast: Secondary | ICD-10-CM | POA: Diagnosis not present

## 2023-01-02 DIAGNOSIS — I10 Essential (primary) hypertension: Secondary | ICD-10-CM | POA: Diagnosis not present

## 2023-01-02 DIAGNOSIS — Z8262 Family history of osteoporosis: Secondary | ICD-10-CM | POA: Diagnosis not present

## 2023-01-02 DIAGNOSIS — Z1330 Encounter for screening examination for mental health and behavioral disorders, unspecified: Secondary | ICD-10-CM | POA: Diagnosis not present

## 2023-01-02 DIAGNOSIS — Z1211 Encounter for screening for malignant neoplasm of colon: Secondary | ICD-10-CM | POA: Diagnosis not present

## 2023-01-02 MED ORDER — AMPHETAMINE-DEXTROAMPHET ER 25 MG PO CP24
25.0000 mg | ORAL_CAPSULE | Freq: Every morning | ORAL | 0 refills | Status: DC
  Filled 2023-01-02: qty 30, 30d supply, fill #0

## 2023-01-03 DIAGNOSIS — F4323 Adjustment disorder with mixed anxiety and depressed mood: Secondary | ICD-10-CM | POA: Diagnosis not present

## 2023-01-07 ENCOUNTER — Other Ambulatory Visit (HOSPITAL_COMMUNITY): Payer: Self-pay

## 2023-01-07 MED ORDER — AMPHETAMINE-DEXTROAMPHET ER 25 MG PO CP24
25.0000 mg | ORAL_CAPSULE | Freq: Every morning | ORAL | 0 refills | Status: AC
  Filled 2023-04-16: qty 30, 30d supply, fill #0

## 2023-01-07 MED ORDER — AMPHETAMINE-DEXTROAMPHET ER 25 MG PO CP24
25.0000 mg | ORAL_CAPSULE | Freq: Every morning | ORAL | 0 refills | Status: AC
  Filled 2023-01-31: qty 30, 30d supply, fill #0

## 2023-01-07 MED ORDER — AMPHETAMINE-DEXTROAMPHET ER 25 MG PO CP24
25.0000 mg | ORAL_CAPSULE | Freq: Every morning | ORAL | 0 refills | Status: AC
  Filled 2023-03-05: qty 30, 30d supply, fill #0

## 2023-01-07 MED ORDER — BUPROPION HCL ER (XL) 300 MG PO TB24
300.0000 mg | ORAL_TABLET | Freq: Every morning | ORAL | 3 refills | Status: DC
  Filled 2023-01-07 – 2023-01-28 (×2): qty 90, 90d supply, fill #0
  Filled 2023-05-04: qty 90, 90d supply, fill #1
  Filled 2023-07-25: qty 90, 90d supply, fill #2
  Filled 2023-11-04: qty 90, 90d supply, fill #3

## 2023-01-10 DIAGNOSIS — F4323 Adjustment disorder with mixed anxiety and depressed mood: Secondary | ICD-10-CM | POA: Diagnosis not present

## 2023-01-17 ENCOUNTER — Other Ambulatory Visit (HOSPITAL_COMMUNITY): Payer: Self-pay

## 2023-01-17 DIAGNOSIS — F4323 Adjustment disorder with mixed anxiety and depressed mood: Secondary | ICD-10-CM | POA: Diagnosis not present

## 2023-01-28 ENCOUNTER — Other Ambulatory Visit (HOSPITAL_COMMUNITY): Payer: Self-pay

## 2023-01-31 ENCOUNTER — Other Ambulatory Visit (HOSPITAL_COMMUNITY): Payer: Self-pay

## 2023-01-31 DIAGNOSIS — F4323 Adjustment disorder with mixed anxiety and depressed mood: Secondary | ICD-10-CM | POA: Diagnosis not present

## 2023-02-07 DIAGNOSIS — F4323 Adjustment disorder with mixed anxiety and depressed mood: Secondary | ICD-10-CM | POA: Diagnosis not present

## 2023-02-14 DIAGNOSIS — F4323 Adjustment disorder with mixed anxiety and depressed mood: Secondary | ICD-10-CM | POA: Diagnosis not present

## 2023-02-21 DIAGNOSIS — F4323 Adjustment disorder with mixed anxiety and depressed mood: Secondary | ICD-10-CM | POA: Diagnosis not present

## 2023-02-21 DIAGNOSIS — Z1231 Encounter for screening mammogram for malignant neoplasm of breast: Secondary | ICD-10-CM | POA: Diagnosis not present

## 2023-02-28 DIAGNOSIS — F4323 Adjustment disorder with mixed anxiety and depressed mood: Secondary | ICD-10-CM | POA: Diagnosis not present

## 2023-03-05 ENCOUNTER — Other Ambulatory Visit (HOSPITAL_COMMUNITY): Payer: Self-pay

## 2023-03-07 DIAGNOSIS — F4323 Adjustment disorder with mixed anxiety and depressed mood: Secondary | ICD-10-CM | POA: Diagnosis not present

## 2023-03-14 DIAGNOSIS — F4323 Adjustment disorder with mixed anxiety and depressed mood: Secondary | ICD-10-CM | POA: Diagnosis not present

## 2023-03-19 ENCOUNTER — Other Ambulatory Visit (HOSPITAL_COMMUNITY): Payer: Self-pay

## 2023-03-19 DIAGNOSIS — Z0000 Encounter for general adult medical examination without abnormal findings: Secondary | ICD-10-CM | POA: Diagnosis not present

## 2023-03-19 DIAGNOSIS — Z1211 Encounter for screening for malignant neoplasm of colon: Secondary | ICD-10-CM | POA: Diagnosis not present

## 2023-03-19 DIAGNOSIS — E785 Hyperlipidemia, unspecified: Secondary | ICD-10-CM | POA: Diagnosis not present

## 2023-03-19 DIAGNOSIS — F900 Attention-deficit hyperactivity disorder, predominantly inattentive type: Secondary | ICD-10-CM | POA: Diagnosis not present

## 2023-03-19 DIAGNOSIS — E559 Vitamin D deficiency, unspecified: Secondary | ICD-10-CM | POA: Diagnosis not present

## 2023-03-19 DIAGNOSIS — F419 Anxiety disorder, unspecified: Secondary | ICD-10-CM | POA: Diagnosis not present

## 2023-03-19 DIAGNOSIS — F32A Depression, unspecified: Secondary | ICD-10-CM | POA: Diagnosis not present

## 2023-03-19 DIAGNOSIS — E669 Obesity, unspecified: Secondary | ICD-10-CM | POA: Diagnosis not present

## 2023-03-19 DIAGNOSIS — Z23 Encounter for immunization: Secondary | ICD-10-CM | POA: Diagnosis not present

## 2023-03-19 MED ORDER — AMPHETAMINE-DEXTROAMPHET ER 25 MG PO CP24: 25.0000 mg | ORAL_CAPSULE | Freq: Every morning | ORAL | 0 refills | Status: AC

## 2023-03-19 MED ORDER — AMPHETAMINE-DEXTROAMPHET ER 25 MG PO CP24
25.0000 mg | ORAL_CAPSULE | Freq: Every morning | ORAL | 0 refills | Status: AC
  Filled 2023-05-15: qty 14, 14d supply, fill #0
  Filled 2023-05-15: qty 16, 16d supply, fill #0

## 2023-03-21 DIAGNOSIS — F4323 Adjustment disorder with mixed anxiety and depressed mood: Secondary | ICD-10-CM | POA: Diagnosis not present

## 2023-03-28 DIAGNOSIS — F4323 Adjustment disorder with mixed anxiety and depressed mood: Secondary | ICD-10-CM | POA: Diagnosis not present

## 2023-04-09 ENCOUNTER — Other Ambulatory Visit (HOSPITAL_COMMUNITY): Payer: Self-pay

## 2023-04-11 DIAGNOSIS — F4323 Adjustment disorder with mixed anxiety and depressed mood: Secondary | ICD-10-CM | POA: Diagnosis not present

## 2023-04-16 ENCOUNTER — Other Ambulatory Visit (HOSPITAL_COMMUNITY): Payer: Self-pay

## 2023-04-18 DIAGNOSIS — F4323 Adjustment disorder with mixed anxiety and depressed mood: Secondary | ICD-10-CM | POA: Diagnosis not present

## 2023-04-25 DIAGNOSIS — F4323 Adjustment disorder with mixed anxiety and depressed mood: Secondary | ICD-10-CM | POA: Diagnosis not present

## 2023-05-02 DIAGNOSIS — F4323 Adjustment disorder with mixed anxiety and depressed mood: Secondary | ICD-10-CM | POA: Diagnosis not present

## 2023-05-15 ENCOUNTER — Other Ambulatory Visit (HOSPITAL_COMMUNITY): Payer: Self-pay

## 2023-05-23 DIAGNOSIS — F4323 Adjustment disorder with mixed anxiety and depressed mood: Secondary | ICD-10-CM | POA: Diagnosis not present

## 2023-05-25 DIAGNOSIS — R1111 Vomiting without nausea: Secondary | ICD-10-CM | POA: Diagnosis not present

## 2023-05-25 DIAGNOSIS — R55 Syncope and collapse: Secondary | ICD-10-CM | POA: Diagnosis not present

## 2023-05-25 DIAGNOSIS — I959 Hypotension, unspecified: Secondary | ICD-10-CM | POA: Diagnosis not present

## 2023-05-25 DIAGNOSIS — R231 Pallor: Secondary | ICD-10-CM | POA: Diagnosis not present

## 2023-05-25 DIAGNOSIS — R531 Weakness: Secondary | ICD-10-CM | POA: Diagnosis not present

## 2023-05-26 DIAGNOSIS — E739 Lactose intolerance, unspecified: Secondary | ICD-10-CM | POA: Diagnosis not present

## 2023-05-26 DIAGNOSIS — F32A Depression, unspecified: Secondary | ICD-10-CM | POA: Diagnosis not present

## 2023-05-26 DIAGNOSIS — Z79899 Other long term (current) drug therapy: Secondary | ICD-10-CM | POA: Diagnosis not present

## 2023-05-26 DIAGNOSIS — R42 Dizziness and giddiness: Secondary | ICD-10-CM | POA: Diagnosis not present

## 2023-05-26 DIAGNOSIS — R531 Weakness: Secondary | ICD-10-CM | POA: Diagnosis not present

## 2023-05-26 DIAGNOSIS — K219 Gastro-esophageal reflux disease without esophagitis: Secondary | ICD-10-CM | POA: Diagnosis not present

## 2023-05-26 DIAGNOSIS — R55 Syncope and collapse: Secondary | ICD-10-CM | POA: Diagnosis not present

## 2023-05-26 DIAGNOSIS — F419 Anxiety disorder, unspecified: Secondary | ICD-10-CM | POA: Diagnosis not present

## 2023-05-26 DIAGNOSIS — E876 Hypokalemia: Secondary | ICD-10-CM | POA: Diagnosis not present

## 2023-06-06 DIAGNOSIS — F4323 Adjustment disorder with mixed anxiety and depressed mood: Secondary | ICD-10-CM | POA: Diagnosis not present

## 2023-06-17 ENCOUNTER — Other Ambulatory Visit (HOSPITAL_COMMUNITY): Payer: Self-pay

## 2023-06-17 MED ORDER — AMPHETAMINE-DEXTROAMPHET ER 25 MG PO CP24
25.0000 mg | ORAL_CAPSULE | Freq: Every morning | ORAL | 0 refills | Status: AC
  Filled 2023-06-17: qty 30, 30d supply, fill #0

## 2023-06-17 MED ORDER — AMPHETAMINE-DEXTROAMPHET ER 25 MG PO CP24
25.0000 mg | ORAL_CAPSULE | Freq: Every morning | ORAL | 0 refills | Status: AC
Start: 2023-07-17 — End: ?
  Filled 2023-07-25: qty 30, 30d supply, fill #0

## 2023-06-17 MED ORDER — AMPHETAMINE-DEXTROAMPHET ER 25 MG PO CP24
25.0000 mg | ORAL_CAPSULE | Freq: Every morning | ORAL | 0 refills | Status: DC
  Filled 2023-08-30: qty 30, 30d supply, fill #0

## 2023-06-20 DIAGNOSIS — F4323 Adjustment disorder with mixed anxiety and depressed mood: Secondary | ICD-10-CM | POA: Diagnosis not present

## 2023-07-04 DIAGNOSIS — F4323 Adjustment disorder with mixed anxiety and depressed mood: Secondary | ICD-10-CM | POA: Diagnosis not present

## 2023-07-11 DIAGNOSIS — F4323 Adjustment disorder with mixed anxiety and depressed mood: Secondary | ICD-10-CM | POA: Diagnosis not present

## 2023-07-18 DIAGNOSIS — F4323 Adjustment disorder with mixed anxiety and depressed mood: Secondary | ICD-10-CM | POA: Diagnosis not present

## 2023-07-25 ENCOUNTER — Other Ambulatory Visit (HOSPITAL_COMMUNITY): Payer: Self-pay

## 2023-07-25 ENCOUNTER — Other Ambulatory Visit: Payer: Self-pay

## 2023-07-26 ENCOUNTER — Other Ambulatory Visit (HOSPITAL_COMMUNITY): Payer: Self-pay

## 2023-07-26 ENCOUNTER — Other Ambulatory Visit: Payer: Self-pay

## 2023-07-26 MED ORDER — LEVOCETIRIZINE DIHYDROCHLORIDE 5 MG PO TABS
5.0000 mg | ORAL_TABLET | Freq: Every day | ORAL | 3 refills | Status: AC
  Filled 2023-07-26: qty 90, 90d supply, fill #0
  Filled 2023-11-04: qty 90, 90d supply, fill #1
  Filled 2024-02-05: qty 90, 90d supply, fill #2

## 2023-07-27 ENCOUNTER — Other Ambulatory Visit (HOSPITAL_COMMUNITY): Payer: Self-pay

## 2023-07-29 DIAGNOSIS — F4323 Adjustment disorder with mixed anxiety and depressed mood: Secondary | ICD-10-CM | POA: Diagnosis not present

## 2023-07-30 ENCOUNTER — Other Ambulatory Visit (HOSPITAL_COMMUNITY): Payer: Self-pay

## 2023-08-08 DIAGNOSIS — F4323 Adjustment disorder with mixed anxiety and depressed mood: Secondary | ICD-10-CM | POA: Diagnosis not present

## 2023-08-12 DIAGNOSIS — F4323 Adjustment disorder with mixed anxiety and depressed mood: Secondary | ICD-10-CM | POA: Diagnosis not present

## 2023-08-13 ENCOUNTER — Other Ambulatory Visit (HOSPITAL_COMMUNITY): Payer: Self-pay

## 2023-08-13 DIAGNOSIS — F4310 Post-traumatic stress disorder, unspecified: Secondary | ICD-10-CM | POA: Diagnosis not present

## 2023-08-13 DIAGNOSIS — F900 Attention-deficit hyperactivity disorder, predominantly inattentive type: Secondary | ICD-10-CM | POA: Diagnosis not present

## 2023-08-13 DIAGNOSIS — F331 Major depressive disorder, recurrent, moderate: Secondary | ICD-10-CM | POA: Diagnosis not present

## 2023-08-13 MED ORDER — VILAZODONE HCL 10 MG PO TABS
10.0000 mg | ORAL_TABLET | Freq: Every day | ORAL | 0 refills | Status: AC
  Filled 2023-08-13: qty 90, 90d supply, fill #0

## 2023-08-22 DIAGNOSIS — F4323 Adjustment disorder with mixed anxiety and depressed mood: Secondary | ICD-10-CM | POA: Diagnosis not present

## 2023-08-30 ENCOUNTER — Other Ambulatory Visit (HOSPITAL_COMMUNITY): Payer: Self-pay

## 2023-09-05 DIAGNOSIS — F4323 Adjustment disorder with mixed anxiety and depressed mood: Secondary | ICD-10-CM | POA: Diagnosis not present

## 2023-09-12 DIAGNOSIS — F4323 Adjustment disorder with mixed anxiety and depressed mood: Secondary | ICD-10-CM | POA: Diagnosis not present

## 2023-09-28 ENCOUNTER — Other Ambulatory Visit (HOSPITAL_COMMUNITY): Payer: Self-pay

## 2023-10-01 ENCOUNTER — Other Ambulatory Visit (HOSPITAL_COMMUNITY): Payer: Self-pay

## 2023-10-01 MED ORDER — AMPHETAMINE-DEXTROAMPHET ER 25 MG PO CP24
ORAL_CAPSULE | Freq: Every morning | ORAL | 0 refills | Status: DC
  Filled 2023-10-01: qty 30, 30d supply, fill #0

## 2023-10-10 DIAGNOSIS — F4323 Adjustment disorder with mixed anxiety and depressed mood: Secondary | ICD-10-CM | POA: Diagnosis not present

## 2023-10-24 DIAGNOSIS — F4323 Adjustment disorder with mixed anxiety and depressed mood: Secondary | ICD-10-CM | POA: Diagnosis not present

## 2023-10-31 DIAGNOSIS — F4323 Adjustment disorder with mixed anxiety and depressed mood: Secondary | ICD-10-CM | POA: Diagnosis not present

## 2023-11-04 ENCOUNTER — Other Ambulatory Visit (HOSPITAL_COMMUNITY): Payer: Self-pay

## 2023-11-04 MED ORDER — AMPHETAMINE-DEXTROAMPHET ER 25 MG PO CP24
25.0000 mg | ORAL_CAPSULE | Freq: Every morning | ORAL | 0 refills | Status: DC
  Filled 2023-11-04: qty 30, 30d supply, fill #0

## 2023-11-05 ENCOUNTER — Other Ambulatory Visit (HOSPITAL_COMMUNITY): Payer: Self-pay

## 2023-11-07 DIAGNOSIS — F4323 Adjustment disorder with mixed anxiety and depressed mood: Secondary | ICD-10-CM | POA: Diagnosis not present

## 2023-11-21 DIAGNOSIS — F4323 Adjustment disorder with mixed anxiety and depressed mood: Secondary | ICD-10-CM | POA: Diagnosis not present

## 2023-11-28 DIAGNOSIS — F4323 Adjustment disorder with mixed anxiety and depressed mood: Secondary | ICD-10-CM | POA: Diagnosis not present

## 2023-11-29 ENCOUNTER — Other Ambulatory Visit (HOSPITAL_COMMUNITY): Payer: Self-pay

## 2023-11-29 ENCOUNTER — Other Ambulatory Visit: Payer: Self-pay

## 2023-11-29 MED ORDER — VILAZODONE HCL 10 MG PO TABS
10.0000 mg | ORAL_TABLET | Freq: Every day | ORAL | 10 refills | Status: AC
  Filled 2023-11-29: qty 90, 90d supply, fill #0

## 2023-12-04 ENCOUNTER — Other Ambulatory Visit (HOSPITAL_COMMUNITY): Payer: Self-pay

## 2023-12-04 MED ORDER — AMPHETAMINE-DEXTROAMPHET ER 25 MG PO CP24
25.0000 mg | ORAL_CAPSULE | Freq: Every morning | ORAL | 0 refills | Status: AC
  Filled 2023-12-04: qty 30, 30d supply, fill #0

## 2023-12-05 ENCOUNTER — Other Ambulatory Visit (HOSPITAL_COMMUNITY): Payer: Self-pay

## 2023-12-05 DIAGNOSIS — F4323 Adjustment disorder with mixed anxiety and depressed mood: Secondary | ICD-10-CM | POA: Diagnosis not present

## 2023-12-12 DIAGNOSIS — F4323 Adjustment disorder with mixed anxiety and depressed mood: Secondary | ICD-10-CM | POA: Diagnosis not present

## 2023-12-13 ENCOUNTER — Other Ambulatory Visit: Payer: Self-pay | Admitting: Obstetrics and Gynecology

## 2023-12-13 DIAGNOSIS — Z1231 Encounter for screening mammogram for malignant neoplasm of breast: Secondary | ICD-10-CM

## 2023-12-19 DIAGNOSIS — F4323 Adjustment disorder with mixed anxiety and depressed mood: Secondary | ICD-10-CM | POA: Diagnosis not present

## 2024-01-02 DIAGNOSIS — F4323 Adjustment disorder with mixed anxiety and depressed mood: Secondary | ICD-10-CM | POA: Diagnosis not present

## 2024-01-06 ENCOUNTER — Other Ambulatory Visit: Payer: Self-pay

## 2024-01-06 ENCOUNTER — Other Ambulatory Visit (HOSPITAL_COMMUNITY): Payer: Self-pay

## 2024-01-06 MED ORDER — AMPHETAMINE-DEXTROAMPHET ER 25 MG PO CP24
ORAL_CAPSULE | Freq: Every morning | ORAL | 0 refills | Status: AC
  Filled 2024-01-06: qty 30, 30d supply, fill #0

## 2024-01-09 DIAGNOSIS — F4323 Adjustment disorder with mixed anxiety and depressed mood: Secondary | ICD-10-CM | POA: Diagnosis not present

## 2024-01-16 DIAGNOSIS — F4323 Adjustment disorder with mixed anxiety and depressed mood: Secondary | ICD-10-CM | POA: Diagnosis not present

## 2024-02-05 ENCOUNTER — Other Ambulatory Visit (HOSPITAL_COMMUNITY): Payer: Self-pay

## 2024-02-05 MED ORDER — AMPHETAMINE-DEXTROAMPHET ER 25 MG PO CP24
25.0000 mg | ORAL_CAPSULE | Freq: Every morning | ORAL | 0 refills | Status: AC
  Filled 2024-02-05: qty 30, 30d supply, fill #0

## 2024-02-06 ENCOUNTER — Other Ambulatory Visit (HOSPITAL_COMMUNITY): Payer: Self-pay

## 2024-02-06 MED ORDER — BUPROPION HCL ER (XL) 300 MG PO TB24
300.0000 mg | ORAL_TABLET | Freq: Every morning | ORAL | 3 refills | Status: AC
  Filled 2024-02-06: qty 90, 90d supply, fill #0

## 2024-02-08 ENCOUNTER — Other Ambulatory Visit (HOSPITAL_COMMUNITY): Payer: Self-pay

## 2024-02-10 ENCOUNTER — Other Ambulatory Visit (HOSPITAL_COMMUNITY): Payer: Self-pay
